# Patient Record
Sex: Female | Born: 1985 | Race: Black or African American | Hispanic: No | Marital: Single | Smoking: Former smoker
Health system: Southern US, Community
[De-identification: ages and names within clinical notes are randomized; demographics above are authoritative.]

## PROBLEM LIST (undated history)

## (undated) DIAGNOSIS — F32A Depression, unspecified: Secondary | ICD-10-CM

## (undated) DIAGNOSIS — F329 Major depressive disorder, single episode, unspecified: Secondary | ICD-10-CM

## (undated) DIAGNOSIS — N871 Moderate cervical dysplasia: Secondary | ICD-10-CM

## (undated) DIAGNOSIS — F419 Anxiety disorder, unspecified: Secondary | ICD-10-CM

## (undated) HISTORY — PX: COLPOSCOPY: SHX161

## (undated) HISTORY — DX: Moderate cervical dysplasia: N87.1

## (undated) HISTORY — PX: WISDOM TOOTH EXTRACTION: SHX21

---

## 2005-10-20 ENCOUNTER — Other Ambulatory Visit: Admission: RE | Admit: 2005-10-20 | Discharge: 2005-10-20 | Payer: Self-pay | Admitting: Gynecology

## 2006-05-09 ENCOUNTER — Emergency Department (HOSPITAL_COMMUNITY): Admission: EM | Admit: 2006-05-09 | Discharge: 2006-05-09 | Payer: Self-pay | Admitting: Emergency Medicine

## 2006-06-02 ENCOUNTER — Emergency Department (HOSPITAL_COMMUNITY): Admission: EM | Admit: 2006-06-02 | Discharge: 2006-06-02 | Payer: Self-pay | Admitting: Emergency Medicine

## 2006-10-24 ENCOUNTER — Other Ambulatory Visit: Admission: RE | Admit: 2006-10-24 | Discharge: 2006-10-24 | Payer: Self-pay | Admitting: Gynecology

## 2007-12-19 ENCOUNTER — Other Ambulatory Visit: Admission: RE | Admit: 2007-12-19 | Discharge: 2007-12-19 | Payer: Self-pay | Admitting: Gynecology

## 2008-07-14 ENCOUNTER — Other Ambulatory Visit: Admission: RE | Admit: 2008-07-14 | Discharge: 2008-07-14 | Payer: Self-pay | Admitting: Gynecology

## 2011-12-21 ENCOUNTER — Encounter (HOSPITAL_COMMUNITY): Payer: Self-pay | Admitting: Emergency Medicine

## 2011-12-21 ENCOUNTER — Emergency Department (HOSPITAL_COMMUNITY)
Admission: EM | Admit: 2011-12-21 | Discharge: 2011-12-22 | Disposition: A | Discharge status: Home / Self Care | Payer: Self-pay | Attending: Emergency Medicine | Admitting: Emergency Medicine

## 2011-12-21 ENCOUNTER — Encounter (HOSPITAL_COMMUNITY): Payer: Self-pay | Admitting: *Deleted

## 2011-12-21 ENCOUNTER — Emergency Department (INDEPENDENT_AMBULATORY_CARE_PROVIDER_SITE_OTHER)
Admission: EM | Admit: 2011-12-21 | Discharge: 2011-12-21 | Disposition: A | Discharge status: Nursing Home (Includes ICF) | Payer: Self-pay | Source: Home / Self Care | Attending: Emergency Medicine | Admitting: Emergency Medicine

## 2011-12-21 DIAGNOSIS — T63391A Toxic effect of venom of other spider, accidental (unintentional), initial encounter: Secondary | ICD-10-CM | POA: Insufficient documentation

## 2011-12-21 DIAGNOSIS — R2990 Unspecified symptoms and signs involving the nervous system: Secondary | ICD-10-CM

## 2011-12-21 DIAGNOSIS — T63311A Toxic effect of venom of black widow spider, accidental (unintentional), initial encounter: Secondary | ICD-10-CM

## 2011-12-21 DIAGNOSIS — R29818 Other symptoms and signs involving the nervous system: Secondary | ICD-10-CM

## 2011-12-21 DIAGNOSIS — T6391XA Toxic effect of contact with unspecified venomous animal, accidental (unintentional), initial encounter: Secondary | ICD-10-CM | POA: Insufficient documentation

## 2011-12-21 LAB — CBC WITH DIFFERENTIAL/PLATELET
Basophils Absolute: 0.1 10*3/uL (ref 0.0–0.1)
Eosinophils Relative: 2 % (ref 0–5)
HCT: 37.5 % (ref 36.0–46.0)
Lymphocytes Relative: 25 % (ref 12–46)
MCHC: 33.3 g/dL (ref 30.0–36.0)
MCV: 83.7 fL (ref 78.0–100.0)
Monocytes Absolute: 0.7 10*3/uL (ref 0.1–1.0)
RDW: 13.2 % (ref 11.5–15.5)
WBC: 13.1 10*3/uL — ABNORMAL HIGH (ref 4.0–10.5)

## 2011-12-21 LAB — COMPREHENSIVE METABOLIC PANEL
AST: 15 U/L (ref 0–37)
CO2: 25 mEq/L (ref 19–32)
Calcium: 9.6 mg/dL (ref 8.4–10.5)
Creatinine, Ser: 0.65 mg/dL (ref 0.50–1.10)
GFR calc Af Amer: >90 mL/min (ref >90)
GFR calc non Af Amer: >90 mL/min (ref >90)

## 2011-12-21 LAB — URINALYSIS, ROUTINE W REFLEX MICROSCOPIC
Nitrite: NEGATIVE
Specific Gravity, Urine: 1.024 (ref 1.005–1.030)
Urobilinogen, UA: 0.2 mg/dL (ref 0.0–1.0)

## 2011-12-21 LAB — URINE MICROSCOPIC-ADD ON

## 2011-12-21 LAB — PREGNANCY, URINE: Preg Test, Ur: NEGATIVE

## 2011-12-21 NOTE — ED Notes (Signed)
Patient reports feeling nauseated, stomach pain, diarrhea, no vomiting.  Patient has a headache now.  Reports numbness and warmth sensation in  right arm, right side of face. Patient reports blurred vision in right eye.  Patient noticed bump to right upper arm, initially was red.  Now areas is purple. Right radial pulse 2 plus.  Patient drew a circle around area on right upper arm.  Symptom onset around 11:30 today.  Patient thinks this was a spider bite, she was sweeping an area that has spiders.

## 2011-12-21 NOTE — ED Notes (Signed)
PT reports on menstrual cycle after providing urine sample.

## 2011-12-21 NOTE — ED Notes (Signed)
Pt is oriented x3 and speaking full sentences without difficulty breathing. Pt believes she was beaten by a spider while sweeping, and also states seeing a wasp. Pt's bite is on her Rt upper forearm and its tender and red. Pt states no pain if you touch area and some blurriness to the Rt eye. Skin is warm and dryer and I advise Pt to inform the front desk if anything changes.

## 2011-12-21 NOTE — ED Provider Notes (Addendum)
History     CSN: [id number]  Arrival date & time 12/21/11  1455   First MD Initiated Contact with Patient 12/21/11 1614      Chief Complaint  Patient presents with  . Insect Bite    (Consider location/radiation/quality/duration/timing/severity/associated sxs/prior treatment) HPI Comments: Patient presents urgent care complaining of feeling nauseous having abdominal discomforts (points to epigastric area), diarrhea and a headache. All the symptoms started after 11:30 this morning, along with other symptoms that established afterwards as the right side of her face or upper arm down to her hand and her right lower leg feels numb. At one point she felt that her right leg was weaker to the point that she stumbled a bit but that has improved since then. Patient was sweeping the floor at work when symptoms first started. She reports a lot of spider webs and was informed that she had a black spot on her right inner aspect of her right upper arm. She did not see anything biting or stinging her but does have significant tenderness, he is wondering if she was stung or bitten by a spider, I suspect that her symptoms might be related to this. She also reports right eye blurriness.  The history is provided by the patient.    History reviewed. No pertinent past medical history.  History reviewed. No pertinent past surgical history.  No family history on file.  History  Substance Use Topics  . Smoking status: Never Smoker   . Smokeless tobacco: Not on file  . Alcohol Use: Yes    OB History    Grav Para Term Preterm Abortions TAB SAB Ect Mult Living                  Review of Systems  Constitutional: Positive for activity change. Negative for fever, chills, diaphoresis, appetite change and fatigue.  HENT: Negative for neck stiffness.   Eyes: Positive for visual disturbance. Negative for photophobia and redness.  Gastrointestinal: Positive for nausea, abdominal pain and diarrhea. Negative  for blood in stool.  Musculoskeletal: Negative for joint swelling.  Skin: Positive for color change and rash. Negative for wound.  Neurological: Positive for numbness and headaches. Negative for dizziness, tremors, syncope, speech difficulty, weakness and light-headedness.    Allergies  Citric acid  Home Medications   Current Outpatient Rx  Name Route Sig Dispense Refill  . VALACYCLOVIR HCL 500 MG PO TABS Oral Take 500 mg by mouth 2 (two) times daily.      BP 131/84  Pulse 82  Temp 99.4 F (37.4 C) (Oral)  Resp 16  SpO2 96%  LMP 12/21/2011  Physical Exam  Nursing note and vitals reviewed. Constitutional: Vital signs are normal. She appears well-developed and well-nourished.  Eyes: Conjunctivae and EOM are normal. Pupils are equal, round, and reactive to light. Right eye exhibits no chemosis and no exudate. Left eye exhibits no exudate. No scleral icterus. Pupils are equal.  Fundoscopic exam:      The right eye shows no exudate.       The left eye shows no exudate.  Neck: Neck supple.  Abdominal: Soft. She exhibits no distension. There is no tenderness. There is no rebound and no guarding.  Musculoskeletal: She exhibits tenderness. She exhibits no edema.  Neurological: She is alert. She has normal strength. She displays no tremor. A sensory deficit is present. No cranial nerve deficit. She exhibits normal muscle tone. GCS eye subscore is 4. GCS verbal subscore is 5. GCS motor subscore  is 6.       Patient unable to discriminate 2 point tactile touch at 5-10 mm- ion affected areas- RLE-RUE-R FACE  Skin: Bruising noted. No laceration and no lesion noted. There is erythema.       ED Course  Procedures (including critical care time)  Labs Reviewed - No data to display No results found.   1. Multiple neurological symptoms       MDM   Patient presents urgent care with multiple complaints including neurological symptoms describes paresthesias on her face and both upper  and lower extremities associated with visual change (OD), along with her new neurological symptoms she is also describing acute onset of gastrointestinal symptoms (her abdominal exam was unremarkable). Patient is also reporting a suspicion of a spider bite. I have no explanation of her symptoms even considering that she could have had sustained a spider bite with some systemic symptoms for her unilateral onset of neurological symptoms. Patient demonstrated no muscular deficits but somewhat altered and abnormal sensorial tactile deficits on the areas. Transfer patient to the emergency department for further evaluation. Patient looks somewhat comfortable and afebrile.       Jimmie Molly, MD 12/21/11 1610  Jimmie Molly, MD 12/21/11 9604  Jimmie Molly, MD 12/21/11 [phone]

## 2011-12-21 NOTE — ED Notes (Signed)
The  Pt was transferred from [facility].  She thinks she was bitten by a spider to her rt upper arm this am while sweeping spiders this am.  The pt started having numbness rt arm followed by nv and diarrhea headache with  Numbness rt face and rt leg.  The pt no longer has numbness in her rt leg

## 2011-12-21 NOTE — ED Notes (Signed)
Pt states she discovered "spider bite" around 1130 this morning and it was "reddish purple" in color. Went to urgent care and it was "purpleish" in color around 1430 today. Pt arrived to [facility] around 1730 today and the "spider bite" is dark purple/blackish and has expanded outside the original circle that the pt marked around 1130 when she noticed the bite.  Pt has palpable hard area about the size of a large pea slightly lateral to the red spot in the middle of the blackish colored area. Pt is experiencing changes in sensation - numbness in R side upper and lower extremities. Pain 2/10 increases to 4/10 with palpation. Also states pain in back since arrival to [facility]

## 2011-12-22 ENCOUNTER — Emergency Department (HOSPITAL_COMMUNITY): Payer: Self-pay

## 2011-12-22 MED ORDER — LORAZEPAM 1 MG PO TABS: 1.0000 mg | ORAL_TABLET | Freq: Three times a day (TID) | ORAL | Status: AC | PRN

## 2011-12-22 NOTE — ED Provider Notes (Addendum)
History     CSN: [id number]  Arrival date & time 12/21/11  1758   First MD Initiated Contact with Patient 12/21/11 2320      Chief Complaint  Patient presents with  . numbness transient     (Consider location/radiation/quality/duration/timing/severity/associated sxs/prior treatment) Patient is a 26 y.o. female presenting with animal bite. The history is provided by the patient.  Animal Bite  The incident occurred today (pt was at working cleaning up spider webs and later started to develop below sx.). The incident occurred at work. She came to the ER via personal transport. There is an injury to the right upper arm. The pain is mild. It is unlikely that a foreign body is present. Associated symptoms include numbness, abdominal pain, nausea, vomiting, headaches and tingling. Pertinent negatives include no chest pain, no loss of consciousness, no seizures, no cough and no difficulty breathing. She is right-handed. There were no sick contacts. Recently, medical care has been given at another facility (seen at urgent care and sent here).    History reviewed. No pertinent past medical history.  History reviewed. No pertinent past surgical history.  No family history on file.  History  Substance Use Topics  . Smoking status: Never Smoker   . Smokeless tobacco: Not on file  . Alcohol Use: Yes    OB History    Grav Para Term Preterm Abortions TAB SAB Ect Mult Living                  Review of Systems  Respiratory: Negative for cough.   Cardiovascular: Negative for chest pain.  Gastrointestinal: Positive for nausea, vomiting and abdominal pain.  Neurological: Positive for tingling, numbness and headaches. Negative for seizures and loss of consciousness.  All other systems reviewed and are negative.    Allergies  Citric acid  Home Medications   Current Outpatient Rx  Name Route Sig Dispense Refill  . VALACYCLOVIR HCL 500 MG PO TABS Oral Take 500 mg by mouth daily.        BP 134/90  Pulse 73  Temp 98.6 F (37 C) (Oral)  Resp 20  SpO2 99%  LMP 12/21/2011  Physical Exam  Nursing note and vitals reviewed. Constitutional: She is oriented to person, place, and time. She appears well-developed and well-nourished. No distress.  HENT:  Head: Normocephalic and atraumatic.  Mouth/Throat: Oropharynx is clear and moist.  Eyes: Conjunctivae and EOM are normal. Pupils are equal, round, and reactive to light.  Neck: Normal range of motion. Neck supple.  Cardiovascular: Normal rate, regular rhythm and intact distal pulses.   No murmur heard. Pulmonary/Chest: Effort normal and breath sounds normal. No respiratory distress. She has no wheezes. She has no rales.  Abdominal: Soft. She exhibits no distension. There is no tenderness. There is no rebound and no guarding.  Musculoskeletal: Normal range of motion. She exhibits no edema and no tenderness.  Neurological: She is alert and oriented to person, place, and time. She has normal strength. She displays no tremor. No cranial nerve deficit or sensory deficit. Coordination and gait normal.  Skin: Skin is warm and dry. No rash noted. No erythema.  Psychiatric: She has a normal mood and affect. Her behavior is normal.    ED Course  Procedures (including critical care time)  Labs Reviewed  CBC WITH DIFFERENTIAL - Abnormal; Notable for the following:    WBC 13.1 (*)     Neutro Abs 8.8 (*)     All other components within  normal limits  COMPREHENSIVE METABOLIC PANEL - Abnormal; Notable for the following:    Total Bilirubin 1.9 (*)     All other components within normal limits  URINALYSIS, ROUTINE W REFLEX MICROSCOPIC - Abnormal; Notable for the following:    APPearance CLOUDY (*)     Hgb urine dipstick LARGE (*)     Ketones, ur 15 (*)     Leukocytes, UA TRACE (*)     All other components within normal limits  URINE MICROSCOPIC-ADD ON - Abnormal; Notable for the following:    Squamous Epithelial / LPF FEW (*)      All other components within normal limits  PREGNANCY, URINE   Ct Head Wo Contrast  12/22/2011  *RADIOLOGY REPORT*  Clinical Data: Right-sided weakness; headaches and right-sided tingling and numbness.  CT HEAD WITHOUT CONTRAST  Technique:  Contiguous axial images were obtained from the base of the skull through the vertex without contrast.  Comparison: None.  Findings: There is no evidence of acute infarction, mass lesion, or intra- or extra-axial hemorrhage on CT.  The posterior fossa, including the cerebellum, brainstem and fourth ventricle, is within normal limits.  The third and lateral ventricles, and basal ganglia are unremarkable in appearance.  The cerebral hemispheres are symmetric in appearance, with normal gray- white differentiation.  No mass effect or midline shift is seen.  There is no evidence of fracture; visualized osseous structures are unremarkable in appearance.  The visualized portions of the orbits are within normal limits.  The paranasal sinuses and mastoid air cells are well-aerated.  No significant soft tissue abnormalities are seen.  IMPRESSION: Unremarkable noncontrast CT of the head.  Original Report Authenticated By: Tonia Ghent, M.D.     1. Black widow spider bite       MDM   Patient with a history of a possible spider bite today. Given patient's symptoms of nausea vomiting abdominal cramping numbness and tingling in her extremities but most consistent with a black widow bite. Have been 12 hours since the bite and she has an ecchymotic area on her right arm but most of her symptoms now have resolved. Because she was complaining of only right sided tingling head CT was done and was within normal limits.  Patient's labs were within normal limits other than a mild leukocytosis of 13,000.  But normal calcium.  Patient was not given any medication initially and now do not feel that she needs anything. Discussed with her the results and will give her a prescription for  Ativan in case she gets return of abdominal cramping and numbness. She was given strict return precautions and she was discharged home.        Gwyneth Sprout, MD 12/22/11 0111  Gwyneth Sprout, MD 12/22/11 2130

## 2013-08-17 IMAGING — CT CT HEAD W/O CM
1 of 2 series · 13 of 30 positions shown, 17 images · non-contrast
Comparison: None.

CLINICAL DATA: Right-sided weakness; headaches and right-sided
tingling and numbness.

CT HEAD WITHOUT CONTRAST
TECHNIQUE: Contiguous axial images were obtained from the base of
the skull through the vertex without contrast.

[Series 2: brain · axial · 0.49mm/px · z∈[+125,+255]mm · 13 of 32 slices shown, 17 images]
[im 3/32  brain]
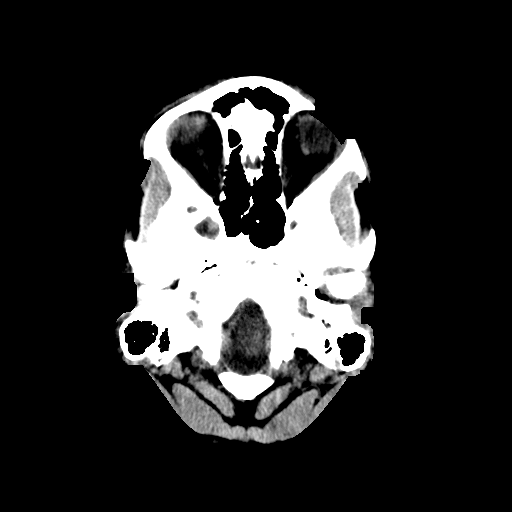
[im 3/32  bone]
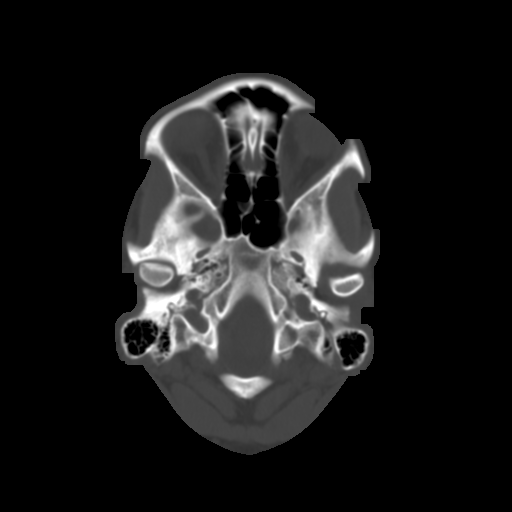
[im 5/32  brain]
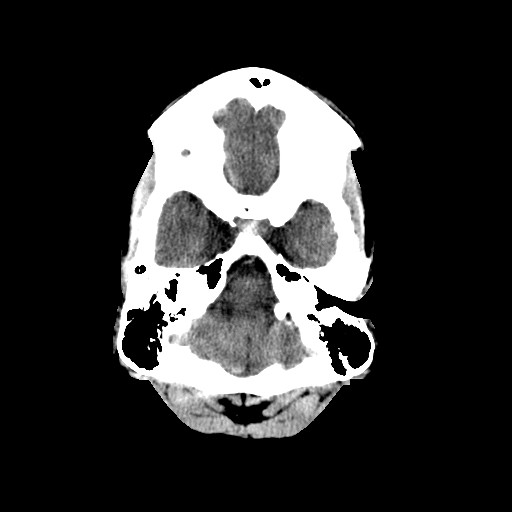
[im 7/32  brain]
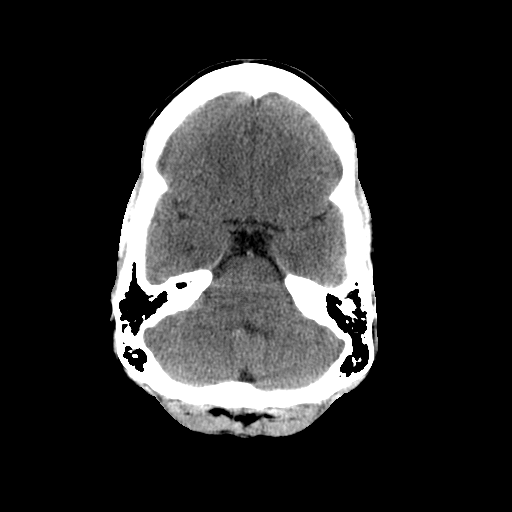
[im 9/32  brain]
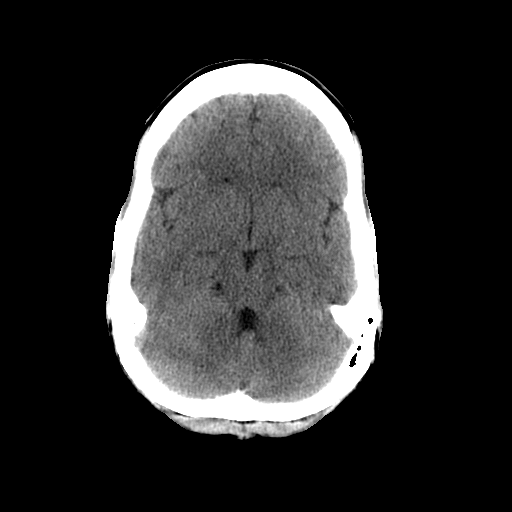
[im 12/32  brain]
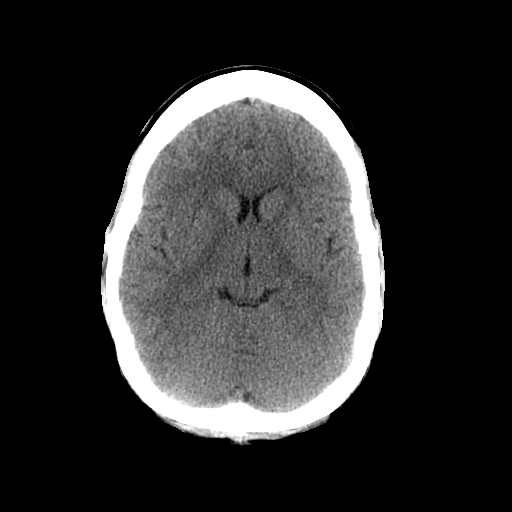
[im 12/32  bone]
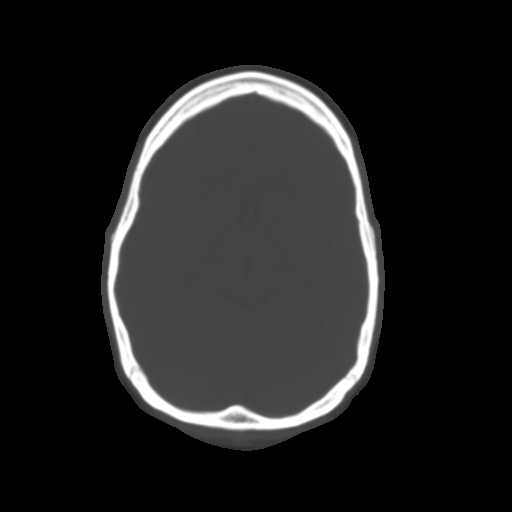
[im 14/32  brain]
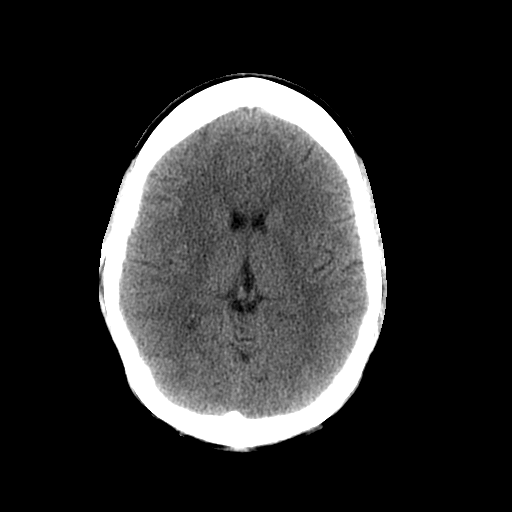
[im 16/32  brain]
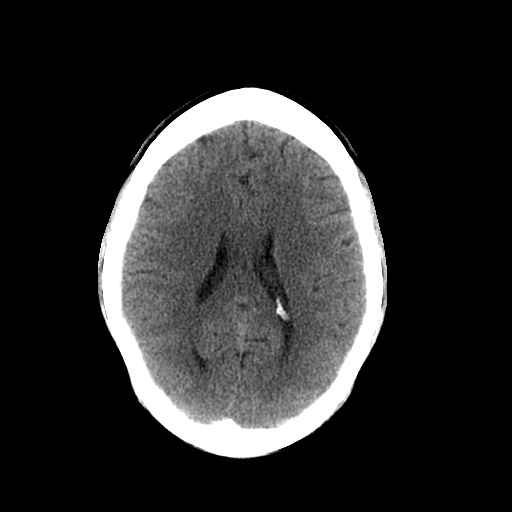
[im 18/32  brain]
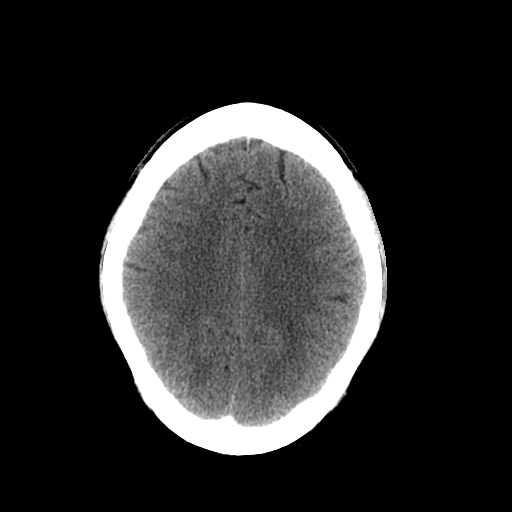
[im 20/32  brain]
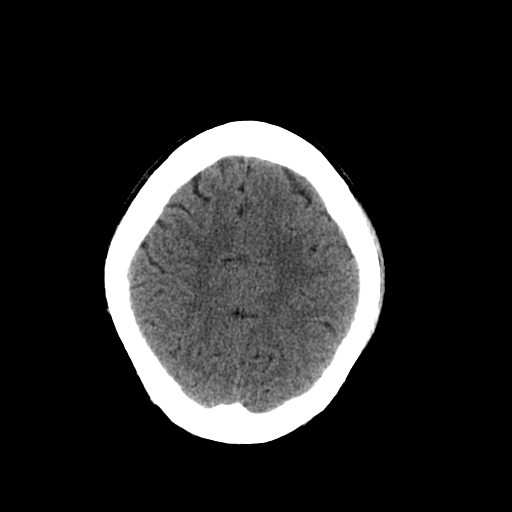
[im 20/32  bone]
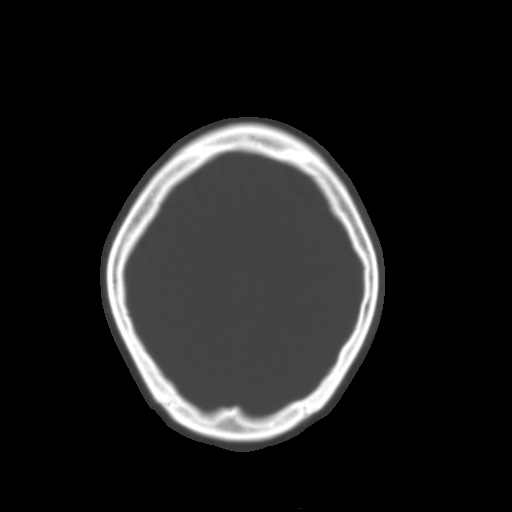
[im 23/32  brain]
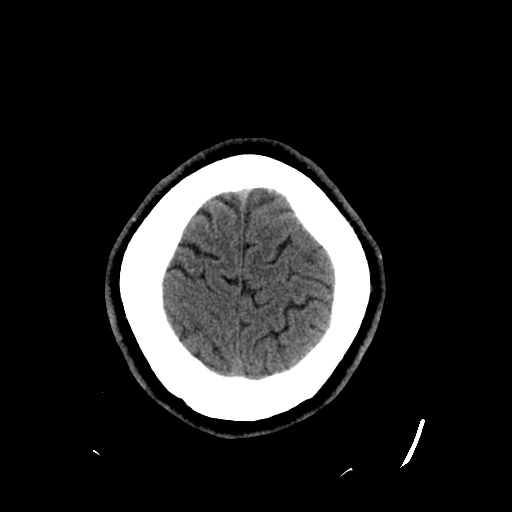
[im 25/32  brain]
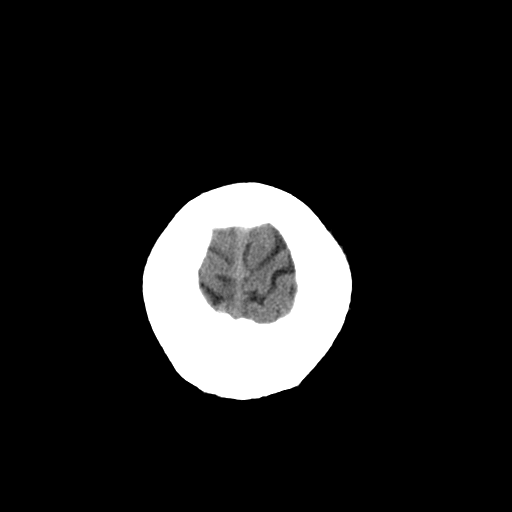
[im 27/32  brain]
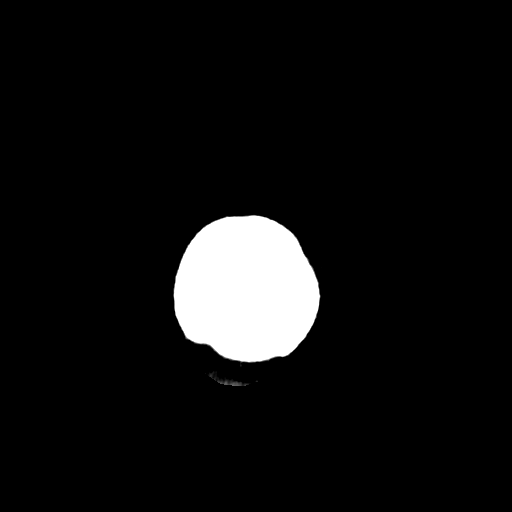
[im 29/32  brain]
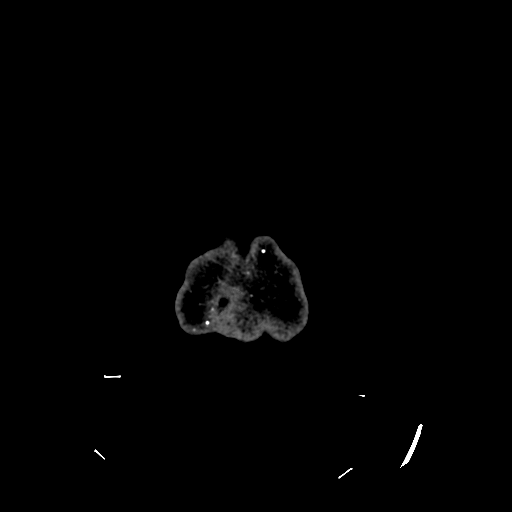
[im 29/32  bone]
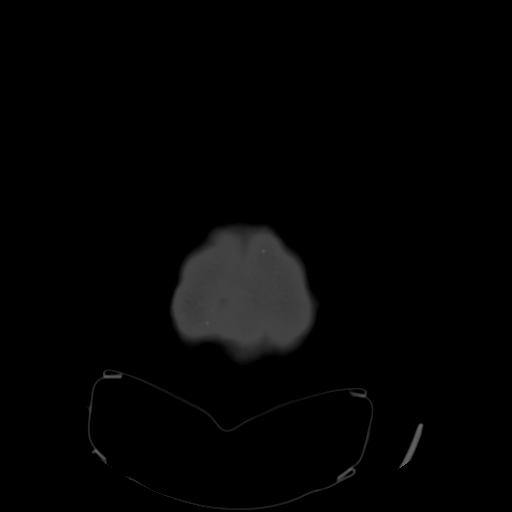

[13 of 30 positions shown; findings below may reference images not displayed]

FINDINGS: There is no evidence of acute infarction, mass lesion, or
intra- or extra-axial hemorrhage on CT.

The posterior fossa, including the cerebellum, brainstem and fourth
ventricle, is within normal limits.  The third and lateral
ventricles, and basal ganglia are unremarkable in appearance.  The
cerebral hemispheres are symmetric in appearance, with normal gray-
white differentiation.  No mass effect or midline shift is seen.

There is no evidence of fracture; visualized osseous structures are
unremarkable in appearance.  The visualized portions of the orbits
are within normal limits.  The paranasal sinuses and mastoid air
cells are well-aerated.  No significant soft tissue abnormalities
are seen.
IMPRESSION: Unremarkable noncontrast CT of the head.

## 2014-04-10 ENCOUNTER — Other Ambulatory Visit (HOSPITAL_COMMUNITY): Payer: Self-pay | Admitting: *Deleted

## 2014-04-10 DIAGNOSIS — N644 Mastodynia: Secondary | ICD-10-CM

## 2014-05-01 ENCOUNTER — Other Ambulatory Visit (HOSPITAL_COMMUNITY): Payer: Self-pay | Admitting: *Deleted

## 2014-05-01 ENCOUNTER — Encounter (HOSPITAL_COMMUNITY): Payer: Self-pay

## 2014-05-01 ENCOUNTER — Ambulatory Visit
Admission: RE | Admit: 2014-05-01 | Discharge: 2014-05-01 | Disposition: A | Discharge status: Home / Self Care | Payer: No Typology Code available for payment source | Source: Ambulatory Visit | Attending: Obstetrics and Gynecology | Admitting: Obstetrics and Gynecology

## 2014-05-01 ENCOUNTER — Ambulatory Visit (HOSPITAL_COMMUNITY)
Admission: RE | Admit: 2014-05-01 | Discharge: 2014-05-01 | Disposition: A | Discharge status: Home / Self Care | Payer: Self-pay | Source: Ambulatory Visit | Attending: Obstetrics and Gynecology | Admitting: Obstetrics and Gynecology

## 2014-05-01 VITALS — BP 112/70 | Ht 63.0 in | Wt 188.2 lb

## 2014-05-01 DIAGNOSIS — Z1239 Encounter for other screening for malignant neoplasm of breast: Secondary | ICD-10-CM

## 2014-05-01 DIAGNOSIS — N644 Mastodynia: Secondary | ICD-10-CM

## 2014-05-01 HISTORY — DX: Anxiety disorder, unspecified: F41.9

## 2014-05-01 HISTORY — DX: Major depressive disorder, single episode, unspecified: F32.9

## 2014-05-01 HISTORY — DX: Depression, unspecified: F32.A

## 2014-05-01 NOTE — Progress Notes (Signed)
Complaints of bilateral outer breast pain that is greater and more frequent within the left breast. Patient rated pain in the left breast at a 5-6 out of 10.  Pap Smear:  Pap smear not completed today. Last Pap smear was in February 2015 at the free cervical cancer screening offered at the [facility] and normal per patient. Per patient has a history of three abnormal Pap smears that a colposcopy was completed for follow-up. The first abnormal Pap smear was 07/14/2008 and the other two were between the first abnormal and most recent Pap smear. The only Pap smear and colposcopy result in EPIC is the ones in 2010.  Physical exam: Breasts Breasts symmetrical. No skin abnormalities bilateral breasts. No nipple retraction bilateral breasts. No nipple discharge bilateral breasts. No lymphadenopathy. No lumps palpated bilateral breasts. Complaints of left outer breast tenderness on exam. Referred patient to the [facility] for possible bilateral breast ultrasounds. Appointment scheduled for Thursday, May 01, 2014 at 1100.     Pelvic/Bimanual No Pap smear completed today since last Pap smear was in February 2015 per patient. Pap smear not indicated per BCCCP guidelines.

## 2014-05-01 NOTE — Patient Instructions (Signed)
Explained to Sophia Phillips that she did not need a Pap smear today due to last Pap smear was in February 2015 per patient. Let patient know her next Pap smear will be due in one year due to her recent history of an abnormal Pap smear. Told patient she can have next Pap smear completed at [facility] and to call Sabrina to schedule. Referred patient to the [facility] for possible bilateral breast ultrasounds. Appointment scheduled for Thursday, May 01, 2014 at 1100. Patient aware of appointment and will be there. Zenaya Ulatowski verbalized understanding.  Eugean Arnott, Arvil Chaco, RN 11:31 AM

## 2014-05-01 NOTE — Addendum Note (Signed)
Encounter addended by: Loletta Parish, RN on: 05/01/2014  3:53 PM<BR>     Documentation filed: Visit Diagnoses

## 2014-05-13 ENCOUNTER — Other Ambulatory Visit: Payer: No Typology Code available for payment source

## 2014-05-13 ENCOUNTER — Ambulatory Visit
Admission: RE | Admit: 2014-05-13 | Discharge: 2014-05-13 | Disposition: A | Discharge status: Home / Self Care | Payer: No Typology Code available for payment source | Source: Ambulatory Visit | Attending: Obstetrics and Gynecology | Admitting: Obstetrics and Gynecology

## 2014-05-13 ENCOUNTER — Inpatient Hospital Stay: Admission: RE | Admit: 2014-05-13 | Payer: No Typology Code available for payment source | Source: Ambulatory Visit

## 2014-05-13 DIAGNOSIS — N644 Mastodynia: Secondary | ICD-10-CM

## 2014-08-12 DIAGNOSIS — R87810 Cervical high risk human papillomavirus (HPV) DNA test positive: Principal | ICD-10-CM

## 2014-08-12 DIAGNOSIS — R87610 Atypical squamous cells of undetermined significance on cytologic smear of cervix (ASC-US): Secondary | ICD-10-CM | POA: Insufficient documentation

## 2014-09-03 ENCOUNTER — Encounter (HOSPITAL_COMMUNITY): Payer: Self-pay | Admitting: *Deleted

## 2014-09-03 ENCOUNTER — Telehealth (HOSPITAL_COMMUNITY): Payer: Self-pay | Admitting: *Deleted

## 2014-09-03 NOTE — Telephone Encounter (Signed)
Patient returned call. Advised patient had scheduled appointment with the [facility] for colpo. Patient is scheduled for March 31. Patient to show pink card when goes for appointment. Patient voiced understanding.

## 2014-09-11 ENCOUNTER — Encounter: Payer: Self-pay | Admitting: Obstetrics & Gynecology

## 2014-10-15 ENCOUNTER — Encounter: Payer: Self-pay | Admitting: Obstetrics & Gynecology

## 2014-10-22 ENCOUNTER — Ambulatory Visit (INDEPENDENT_AMBULATORY_CARE_PROVIDER_SITE_OTHER): Payer: Self-pay | Admitting: Obstetrics & Gynecology

## 2014-10-22 ENCOUNTER — Encounter: Payer: Self-pay | Admitting: Obstetrics & Gynecology

## 2014-10-22 ENCOUNTER — Other Ambulatory Visit (HOSPITAL_COMMUNITY)
Admission: RE | Admit: 2014-10-22 | Discharge: 2014-10-22 | Disposition: A | Discharge status: Home / Self Care | Payer: Medicaid Other | Source: Ambulatory Visit | Attending: Obstetrics & Gynecology | Admitting: Obstetrics & Gynecology

## 2014-10-22 VITALS — BP 150/81 | HR 83 | Temp 97.9°F | Ht 63.0 in | Wt 182.4 lb

## 2014-10-22 DIAGNOSIS — Z3202 Encounter for pregnancy test, result negative: Secondary | ICD-10-CM

## 2014-10-22 DIAGNOSIS — R87610 Atypical squamous cells of undetermined significance on cytologic smear of cervix (ASC-US): Secondary | ICD-10-CM

## 2014-10-22 DIAGNOSIS — N871 Moderate cervical dysplasia: Secondary | ICD-10-CM

## 2014-10-22 DIAGNOSIS — N942 Vaginismus: Secondary | ICD-10-CM

## 2014-10-22 DIAGNOSIS — R87810 Cervical high risk human papillomavirus (HPV) DNA test positive: Principal | ICD-10-CM

## 2014-10-22 HISTORY — DX: Moderate cervical dysplasia: N87.1

## 2014-10-22 LAB — POCT PREGNANCY, URINE: PREG TEST UR: NEGATIVE

## 2014-10-22 NOTE — Progress Notes (Signed)
    GYNECOLOGY CLINIC COLPOSCOPY PROCEDURE NOTE  29 y.o. G0P0000 here for colposcopy for ASCUS with POSITIVE high risk HPV pap smear on 08/12/14. Discussed role for HPV in cervical dysplasia, need for surveillance.  Patient given informed consent, signed copy in the chart, time out was performed.  Placed in lithotomy position. Cervix viewed with speculum and colposcope after application of acetic acid.   Physical Exam  Genitourinary:      Colposcopy adequate? Yes Acetowhite lesion(s) noted at 12 o'clock; corresponding biopsyobtained.  ECC specimen obtained. All specimens were labelled and sent to pathology.  Patient was given post procedure instructions.  Will follow up pathology and manage accordingly.  Routine preventative health maintenance measures emphasized.    Sophia Schneiders, MD, [facility] Attending [place] for [employer], [place]

## 2014-10-22 NOTE — Patient Instructions (Addendum)
Colposcopy, Care After Refer to this sheet in the next few weeks. These instructions provide you with information on caring for yourself after your procedure. Your health care provider may also give you more specific instructions. Your treatment has been planned according to current medical practices, but problems sometimes occur. Call your health care provider if you have any problems or questions after your procedure. WHAT TO EXPECT AFTER THE PROCEDURE  After your procedure, it is typical to have the following:  Cramping. This often goes away in a few minutes.  Soreness. This may last for 2 days.  Lightheadedness. Lie down for a few minutes if this occurs. You may also have some bleeding or dark discharge for a few days. You may need to wear a sanitary pad during this time. HOME CARE INSTRUCTIONS  Avoid sex, douching, and using tampons for 3 days or as directed by your health care provider.  Only take over-the-counter or prescription medicines as directed by your health care provider. Do not take aspirin because it can cause bleeding.  Continue to take birth control pills if you are on them.  Not all test results are available during your visit. If your test results are not back during the visit, make an appointment with your health care provider to find out the results. Do not assume everything is normal if you have not heard from your health care provider or the medical facility. It is important for you to follow up on all of your test results.  Follow your health care provider's advice regarding activity, follow-up visits, and follow-up Pap tests. SEEK MEDICAL CARE IF:  You develop a rash.  You have problems with your medicine. SEEK IMMEDIATE MEDICAL CARE IF:  You are bleeding heavily or are passing blood clots.  You have a fever.  You have abnormal vaginal discharge.  You are having cramps that do not go away after taking your pain medicine.  You feel lightheaded, dizzy, or  faint.  You have stomach pain. Document Released: 03/20/2013 Document Reviewed: 03/20/2013 [facility] Patient Information 2015 [place], [place]. This information is not intended to replace advice given to you by your health care provider. Make sure you discuss any questions you have with your health care provider.   MyChart allows you to send messages to your doctor, view your lab results (as released by your physician), manage appointments, and more. To sign up, log on to [email or web address] using the Address Bar in your browser. Once you are logged on, click on the Sign Up Now link and you will access the new member signup page. Enter your MyChart Activation Code exactly as it appears below to complete the sign-up process. If you do not sign up before the expiration date, you must request a new code.  MyChart Activation Code: [email or web address] Expires: 12/21/2014  1:55 PM  If you have questions, you can call [phone] ([phone]) or e-mail [email or web address]  to talk to our [facility] staff. Remember, MyChart is NOT to be used for urgent needs. For medical emergencies, dial 911.

## 2014-10-22 NOTE — Addendum Note (Signed)
Addended by: Verita Schneiders A on: 10/22/2014 02:35 PM   Modules accepted: Orders

## 2014-10-23 ENCOUNTER — Encounter: Payer: Self-pay | Admitting: Obstetrics & Gynecology

## 2014-10-29 ENCOUNTER — Telehealth: Payer: Self-pay

## 2014-10-29 NOTE — Telephone Encounter (Signed)
Called patient and informed her of results and appointment to discuss procedures. Briefly discussed both with patient. Patient verbalized understanding to all. No questions at this time.

## 2014-10-29 NOTE — Telephone Encounter (Signed)
-----   Message from Mallie Darting sent at 10/27/2014  1:49 PM EDT ----- Appointment is 06/09 @3 :15   ----- Message -----    From: Geanie Logan, RN    Sent: 10/24/2014   8:41 AM      To: [facility] Admin Pool  Please see below and schedule with Dr. Harolyn Rutherford soon. Please send back to clinical pool so that we can inform patient of results and appointment. Thank you!  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  Needs to come to discuss cryotherapy vs LEEP for management.  Needs follow up appointment with me soon.  Please call to inform patient of results and recommendations.

## 2014-11-20 ENCOUNTER — Ambulatory Visit (INDEPENDENT_AMBULATORY_CARE_PROVIDER_SITE_OTHER): Payer: Self-pay | Admitting: Obstetrics & Gynecology

## 2014-11-20 ENCOUNTER — Encounter: Payer: Self-pay | Admitting: Obstetrics & Gynecology

## 2014-11-20 VITALS — BP 131/75 | HR 116 | Temp 98.8°F | Ht 63.0 in | Wt 179.9 lb

## 2014-11-20 DIAGNOSIS — N871 Moderate cervical dysplasia: Secondary | ICD-10-CM

## 2014-11-20 NOTE — Patient Instructions (Signed)
Cervical cryotherapy is a procedure which involves freezing an area of abnormal tissue on the cervix. This tissue gradually disappears and the cervix heals. One cervical cryotherapy is usually sufficient to destroy the abnormal tissue.  Purpose  Cervical cryotherapy is a standard method used to treat cervical dysplasia, meaning the removal of abnormal cell tissue on the cervix.  Description  Cervical cryotherapy, or freezing, usually lasts about five minutes and causes a slight amount of discomfort. The procedure is usually performed in an outpatient setting.  Cervical cryotherapy is done by placing a small freeze-probe (cryoprobe) against the cervix that cools the cervix to sub-zero temperatures. The cells destroyed by freezing are shed afterwards in a heavy watery discharge. The main advantage of cryotherapy is that it is a simple procedure that requires inexpensive equipment.  The cryogenic device consists of a gas tank containing a refrigerant and non-explosive, non-toxic gas (usually nitrous oxide). The gas is delivered using flexible tubing through a gun-type attachment to the cryoprobe.  Diagnosis/Preparation  Women who undergo cervical cryotherapy typically have had an abnormal Pap smear which has led to a diagnosis of cervical squamous dysplasia and usually confirmed by biopsy after an adequate colposcopic exam.  Preparation for cervical cryotherapy involves scheduling the procedure when the patient is not experiencing heavy menstrual flow. Ibuprofen or naproxen sodium may be given before cryotherapy to decrease cramping. If there is any doubt about the pregnancy status, a pregnancy test is performed.  Aftercare  Cervical cryotherapy is often followed by a heavy and often odorous discharge during the first month after the procedure. The discharge is due to the dead tissue cells leaving the treatment site. The patient should abstain from sexual intercourse and not use tampons for a period of two  weeks after the procedure. Excessive exercise should also be avoided to lessen the occurrence of post-therapy bleeding.  Risks  The following risks have been associated with cervical cryotherapy:  Uterine cramping. Often occurs during the cryotherapy but rapidly subsides after treatment.  Bleeding and infection. Rare, but incidences have been reported.  More difficult Pap smears. Future Pap smears and colposcopy may be more difficult after cryotherapy.  Normal results  A normal result is no recurrence of the abnormal cervix cells. The first follow-up Pap smear is done at 12 months then 24 months after the procedure. If normal, patients resume usual pap smear screening.  If any, recurrences usually occur within two years of treatment.  If a follow-up Pap smear is abnormal, a colposcopy with biopsy is usually performed. Other treatment methods, usually the loop electrocautery excision procedure (LEEP) are then used if persistent disease is discovered.  Following the procedure, it is considered normal to experience the following:  slight cramping for two to three days  watery discharge requiring several pad changes daily  Bloody or brown discharge, especially 12-16 days after the procedure  Alternatives  Loop electrocautery excision procedure (LEEP). This procedure uses a fine wire loop with an electric current flowing through it to remove the desired area of the cervix. Loop excision is usually done under local anesthesia and causes very little discomfort.     

## 2014-11-20 NOTE — Progress Notes (Signed)
   CLINIC ENCOUNTER NOTE  History:  29 y.o. 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 She denies any abnormal vaginal discharge, bleeding, pelvic pain or other concerns.   Past Medical History  Diagnosis Date  . Depression   . Anxiety   . 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  [ ]  cryotherapy vs LEEP      History reviewed. No pertinent past surgical history.  The following portions of the patient's history were reviewed and updated as appropriate: allergies, current medications, past family history, past medical history, past social history, past surgical history and problem list.   Review of Systems:  A comprehensive review of systems was negative.  Objective:  Physical Exam BP 131/75 mmHg  Pulse 116  Temp(Src) 98.8 F (37.1 C) (Oral)  Ht 5\' 3"  (1.6 m)  Wt 179 lb 14.4 oz (81.602 kg)  BMI 31.88 kg/m2  LMP 11/03/2014 CONSTITUTIONAL: Well-developed, well-nourished female in no acute distress.  HENT:  Normocephalic, atraumatic, External right and left ear normal. Oropharynx is clear and moist EYES: Conjunctivae and EOM are normal. Pupils are equal, round, and reactive to light. No scleral icterus.  NECK: Normal range of motion, supple, no masses SKIN: Skin is warm and dry. No rash noted. Not diaphoretic. No erythema. No pallor. [place]: Alert and oriented to person, place, and time. Normal reflexes, muscle tone coordination. No cranial nerve deficit noted. PSYCHIATRIC: Normal mood and affect. Normal behavior. Normal judgment and thought content. CARDIOVASCULAR: Normal heart rate noted RESPIRATORY: Effort and breath sounds normal, no problems with respiration noted ABDOMEN: Soft, no distention noted.   PELVIC: Deferred MUSCULOSKELETAL: Normal range of motion. No edema noted.  Labs and Imaging 08/12/14 Pap smear with ASCUS, positive  HRHPV 10/22/14 Colposcopy showed CIN II, negative ECC  Assessment & Plan:  Discussed management of CIN II with LEEP vs cryotherapy, risks/benefits discussed in detail.  Patient wants to proceed with cryotherapy, this will be scheduled for patient.  Please refer to After Visit Summary for other counseling recommendations.    Total face-to-face time with patient: 15 minutes. Over 50% of encounter was spent on counseling and coordination of care.   Verita Schneiders, MD, [place] Attending [place] for [employer], [place]

## 2014-12-10 ENCOUNTER — Encounter: Payer: Self-pay | Admitting: Obstetrics & Gynecology

## 2014-12-10 ENCOUNTER — Ambulatory Visit (INDEPENDENT_AMBULATORY_CARE_PROVIDER_SITE_OTHER): Payer: Self-pay | Admitting: Obstetrics & Gynecology

## 2014-12-10 VITALS — BP 140/80 | HR 82 | Ht 63.75 in | Wt 181.0 lb

## 2014-12-10 DIAGNOSIS — N871 Moderate cervical dysplasia: Secondary | ICD-10-CM

## 2014-12-10 NOTE — Progress Notes (Signed)
    GYNECOLOGY CLINIC PROCEDURE NOTE  Cryotherapy details The indications for cryotherapy (CIN II) were reviewed with the patient in detail. She was counseled about that efficacy of this procedure, and possible need for excisional procedure in the future if her cervical dysplasia persists.  The risks of the procedure where explained in detail and patient was told to expect a copious amount of discharge in the next few weeks. All her questions were answered, and written informed consent was obtained.  The patient was placed in the dorsal lithotomy position and a vaginal speculum was placed. Her cervix was visualized and patient was noted to have had normal size transformation zone. The appropriate cryotherapy probe was picked and affixed to cryotherapy apparatus. Then nitrogen gas was then activated, the probe was coated with lubricating jelly and applied to the transformation zone of the cervix. This was kept in place for 3 minutes. The cryotherapy was then stopped and all instruments were removed from the patient's pelvis; a thawing period of 3 minutes was observed.  A second cycle of cryotherapy was then administered to the cervix for 3 minutes.  The patient tolerated the procedure well without any complications. Routine post procedure instructions were given to the patient.  Will repeat pap smear in 6 months and manage accordingly.   Verita Schneiders, MD, [place] Attending [place] for [employer], [place]

## 2014-12-10 NOTE — Patient Instructions (Signed)
Cervical cryotherapy is a procedure which involves freezing an area of abnormal tissue on the cervix. This tissue gradually disappears and the cervix heals. One cervical cryotherapy is usually sufficient to destroy the abnormal tissue.  Purpose  Cervical cryotherapy is a standard method used to treat cervical dysplasia, meaning the removal of abnormal cell tissue on the cervix.  Description  Cervical cryotherapy, or freezing, usually lasts about five minutes and causes a slight amount of discomfort. The procedure is usually performed in an outpatient setting.  Cervical cryotherapy is done by placing a small freeze-probe (cryoprobe) against the cervix that cools the cervix to sub-zero temperatures. The cells destroyed by freezing are shed afterwards in a heavy watery discharge. The main advantage of cryotherapy is that it is a simple procedure that requires inexpensive equipment.  The cryogenic device consists of a gas tank containing a refrigerant and non-explosive, non-toxic gas (usually nitrous oxide). The gas is delivered using flexible tubing through a gun-type attachment to the cryoprobe.  Diagnosis/Preparation  Women who undergo cervical cryotherapy typically have had an abnormal Pap smear which has led to a diagnosis of cervical squamous dysplasia and usually confirmed by biopsy after an adequate colposcopic exam.  Preparation for cervical cryotherapy involves scheduling the procedure when the patient is not experiencing heavy menstrual flow. Ibuprofen or naproxen sodium may be given before cryotherapy to decrease cramping. If there is any doubt about the pregnancy status, a pregnancy test is performed.  Aftercare  Cervical cryotherapy is often followed by a heavy and often odorous discharge during the first month after the procedure. The discharge is due to the dead tissue cells leaving the treatment site. The patient should abstain from sexual intercourse and not use tampons for a period of two  weeks after the procedure. Excessive exercise should also be avoided to lessen the occurrence of post-therapy bleeding.  Risks  The following risks have been associated with cervical cryotherapy:  Uterine cramping. Often occurs during the cryotherapy but rapidly subsides after treatment.  Bleeding and infection. Rare, but incidences have been reported.  More difficult Pap smears. Future Pap smears and colposcopy may be more difficult after cryotherapy.  Normal results  A normal result is no recurrence of the abnormal cervix cells. The first follow-up Pap smear is done at 12 months then 24 months after the procedure. If normal, patients resume usual pap smear screening.  If any, recurrences usually occur within two years of treatment.  If a follow-up Pap smear is abnormal, a colposcopy with biopsy is usually performed. Other treatment methods, usually the loop electrocautery excision procedure (LEEP) are then used if persistent disease is discovered.  Following the procedure, it is considered normal to experience the following:  slight cramping for two to three days  watery discharge requiring several pad changes daily  Bloody or brown discharge, especially 12-16 days after the procedure  Alternatives  Loop electrocautery excision procedure (LEEP). This procedure uses a fine wire loop with an electric current flowing through it to remove the desired area of the cervix. Loop excision is usually done under local anesthesia and causes very little discomfort.     

## 2015-06-04 ENCOUNTER — Other Ambulatory Visit (HOSPITAL_COMMUNITY)
Admission: RE | Admit: 2015-06-04 | Discharge: 2015-06-04 | Disposition: A | Discharge status: Home / Self Care | Payer: Medicaid Other | Source: Ambulatory Visit | Attending: Obstetrics and Gynecology | Admitting: Obstetrics and Gynecology

## 2015-06-04 ENCOUNTER — Ambulatory Visit (INDEPENDENT_AMBULATORY_CARE_PROVIDER_SITE_OTHER): Payer: Self-pay | Admitting: Obstetrics and Gynecology

## 2015-06-04 ENCOUNTER — Encounter: Payer: Self-pay | Admitting: Obstetrics and Gynecology

## 2015-06-04 VITALS — BP 141/85 | HR 98 | Temp 98.7°F | Ht 63.0 in | Wt 182.0 lb

## 2015-06-04 DIAGNOSIS — Z1151 Encounter for screening for human papillomavirus (HPV): Secondary | ICD-10-CM | POA: Insufficient documentation

## 2015-06-04 DIAGNOSIS — Z01411 Encounter for gynecological examination (general) (routine) with abnormal findings: Secondary | ICD-10-CM | POA: Insufficient documentation

## 2015-06-04 DIAGNOSIS — N871 Moderate cervical dysplasia: Secondary | ICD-10-CM

## 2015-06-04 DIAGNOSIS — Z124 Encounter for screening for malignant neoplasm of cervix: Secondary | ICD-10-CM

## 2015-06-04 NOTE — Progress Notes (Signed)
Patient ID: Sophia Phillips, female   DOB: 01-30-1986, 29 y.o.   MRN: [id number] 29 yo with h/o CIN II s/p cryotherarpy in 11/2014 here for repeat pap smear. Patient is also complaining of a vaginal odor that has been present since the cryotherapy  GENERAL: Well-developed, well-nourished female in no acute distress.  PELVIC: Normal external female genitalia. Vagina is pink and rugated.  Normal discharge. Normal appearing cervix. Uterus is normal in size. No adnexal mass or tenderness. EXTREMITIES: No cyanosis, clubbing, or edema, 2+ distal pulses.  A/P 29 yo with h/o CIN2 here for repeat pap smear - Pap smear collected - wet prep collected - patient will be contacted with abnormal results and management plan

## 2015-06-05 ENCOUNTER — Telehealth: Payer: Self-pay

## 2015-06-05 LAB — WET PREP, GENITAL
TRICH WET PREP: NONE SEEN
Yeast Wet Prep HPF POC: NONE SEEN

## 2015-06-05 LAB — CYTOLOGY - PAP

## 2015-06-05 MED ORDER — METRONIDAZOLE 500 MG PO TABS: 500.0000 mg | ORAL_TABLET | Freq: Two times a day (BID) | ORAL | Status: DC

## 2015-06-05 NOTE — Telephone Encounter (Signed)
Per Dr. Elly Modena, pt needs tx for BV.  Flagyl 500 mg po bid x 7 days e-prescribed.

## 2015-06-05 NOTE — Telephone Encounter (Signed)
Called pt and informed her of wet prep results showing BV and need for treatment. A prescription has been sent to her pharmacy.  Pt voiced understanding.

## 2015-06-23 ENCOUNTER — Telehealth: Payer: Self-pay | Admitting: *Deleted

## 2015-06-23 NOTE — Telephone Encounter (Signed)
Per Dr Elly Modena patient needs colposcopy. Her results are less abnormal than her previous paps however still require colpo. Appointment scheduled for 07/12/14 at 2pm. Called patient and left message to call us back.

## 2015-06-23 NOTE — Telephone Encounter (Addendum)
Called pt and informed her of Pap results and need for Colposcopy. Pt voiced understanding of the results and recommended plan of care but had confusion regarding several other concerns. She stated that she had received a bill for her Pap visit and was of the assumption that she would not receive a bill because she is in the [employer] program. Per chart review, pt was referred to our office from the [facility] program due to abnormal Pap on 08/12/14 and had Colposcopy on 10/22/14.  Pt subsequently had Cryosurgery of cervix on 12/10/14 and was advised to have repeat pap in 6 months which was performed on 06/04/15. Pt also expressed surprise that her follow up Pap was not scheduled with Dr. Harolyn Rutherford since she had performed the Cryo. I advised pt that I will need to check on her status with [facility] and also the billing procedure. I will call her back once I receive clarification. I also stated that pt may have Colposcopy scheduled with Dr. Harolyn Rutherford if she would prefer. Pt stated that Dr. Elly Modena took good care of her but for consistency she would prefer to see only one doctor and would like Coloscopy rescheduled with Dr. Harolyn Rutherford. Pt will be call with change in appointment as well as other information previously mentioned.   1/11  1110  Called pt and informed her that appt for Colpo has been changed to 2/13 @ 1400 w/Dr. Harolyn Rutherford. Pt voiced understanding.

## 2015-07-13 ENCOUNTER — Encounter: Payer: Medicaid Other | Admitting: Obstetrics and Gynecology

## 2015-07-27 ENCOUNTER — Telehealth: Payer: Self-pay | Admitting: *Deleted

## 2015-07-27 ENCOUNTER — Encounter: Payer: Medicaid Other | Admitting: Obstetrics & Gynecology

## 2015-07-27 NOTE — Telephone Encounter (Signed)
Patient wants to know if it is ok for her to travel with her medical conditions. Please call back.

## 2015-07-28 NOTE — Telephone Encounter (Signed)
I called pt and she stated that she is scheduled to go to [facility] on 3/10 for 1 week and wants to know if that will adversely affect her medical condition (abnormal pap).  Pt is currently waiting to get approved with the [facility] program so that she can have Colposcopy as she cannot afford the procedure at out of pocket expense. I told pt that her condition of abnormal Pap will not be affected by her travel plans. We do advise timely follow up as soon as she is able. If she has not heard back from the [facility] program within 1 week. She should call them again or call us back.  Pt voiced understanding.

## 2015-08-13 ENCOUNTER — Ambulatory Visit (HOSPITAL_COMMUNITY)
Admission: RE | Admit: 2015-08-13 | Discharge: 2015-08-13 | Disposition: A | Discharge status: Home / Self Care | Payer: Medicaid Other | Source: Ambulatory Visit | Attending: Obstetrics and Gynecology | Admitting: Obstetrics and Gynecology

## 2015-08-13 ENCOUNTER — Encounter (HOSPITAL_COMMUNITY): Payer: Self-pay

## 2015-08-13 VITALS — BP 138/90 | Temp 98.4°F | Ht 63.75 in | Wt 184.0 lb

## 2015-08-13 DIAGNOSIS — Z1239 Encounter for other screening for malignant neoplasm of breast: Secondary | ICD-10-CM

## 2015-08-13 DIAGNOSIS — R87610 Atypical squamous cells of undetermined significance on cytologic smear of cervix (ASC-US): Secondary | ICD-10-CM

## 2015-08-13 DIAGNOSIS — R87810 Cervical high risk human papillomavirus (HPV) DNA test positive: Secondary | ICD-10-CM

## 2015-08-13 NOTE — Progress Notes (Signed)
Patient referred to [facility] by the [facility] for a colposcopy to follow up for abnormal Pap smear. Last Pap smear was completed 06/04/2015.  Pap Smear:    Pap smear not completed today. Last Pap smear was 06/04/2015 at the [facility] and ASCUS HPV+. Referred patient to the [facility] for a colposcopy. Appointment scheduled for Friday, September 04, 2015 at 0800. Per patient has a history of four abnormal Pap smears that a colposcopy was completed for follow-up. The first abnormal Pap smear was 07/14/2008 and the other two were between the first abnormal and 2016. The last colposcopy was completed 10/22/2014 that showed CIN II with cryotherapy completed for follow up 11/20/2014. Last two Pap smear results along with the one 07/14/2008 are in EPIC. The last colposcopy result and the one in 2010 are in EPIC along with the notes from her cryotherapy.  Physical exam: Breasts Breasts symmetrical. No skin abnormalities bilateral breasts. No nipple retraction bilateral breasts. No nipple discharge bilateral breasts. No lymphadenopathy. No lumps palpated bilateral breasts. No complaints of pain or tenderness on exam. Screening mamogram recommended at age 89 unless clinically indicated prior.  Pelvic/Bimanual No Pap smear completed today since last Pap smear was 06/04/2015. Pap smear not indicated per BCCCP guidelines.   Smoking History: Patient has never smoked.  Patient Navigation: Patient education provided. Access to services provided for patient through [facility] program.

## 2015-08-13 NOTE — Patient Instructions (Signed)
Educational materials on self breast awareness given. Explained the colposcopy to Sophia Phillips the needed follow up for her abnormal Pap smear on 06/04/2015. Referred patient to the [facility] for a colposcopy. Appointment scheduled for Friday, September 04, 2015 at 0800. Patient aware of appointment and will be there or reschedule if needed. Let patient know she will need a screening mammogram at age 30 unless clinically indicated prior. Sophia Phillips verbalized understanding.  Khambrel Amsden, Arvil Chaco, RN 3:58 PM

## 2015-08-21 ENCOUNTER — Encounter (HOSPITAL_COMMUNITY): Payer: Self-pay | Admitting: *Deleted

## 2015-09-04 ENCOUNTER — Encounter: Payer: Medicaid Other | Admitting: Obstetrics & Gynecology

## 2015-09-23 ENCOUNTER — Other Ambulatory Visit (HOSPITAL_COMMUNITY)
Admission: RE | Admit: 2015-09-23 | Discharge: 2015-09-23 | Disposition: A | Discharge status: Home / Self Care | Payer: Self-pay | Source: Ambulatory Visit | Attending: Obstetrics & Gynecology | Admitting: Obstetrics & Gynecology

## 2015-09-23 ENCOUNTER — Encounter: Payer: Self-pay | Admitting: Obstetrics & Gynecology

## 2015-09-23 ENCOUNTER — Ambulatory Visit (INDEPENDENT_AMBULATORY_CARE_PROVIDER_SITE_OTHER): Payer: Self-pay | Admitting: Obstetrics & Gynecology

## 2015-09-23 VITALS — BP 140/88 | HR 95 | Ht 63.75 in | Wt 185.9 lb

## 2015-09-23 DIAGNOSIS — R87610 Atypical squamous cells of undetermined significance on cytologic smear of cervix (ASC-US): Secondary | ICD-10-CM

## 2015-09-23 DIAGNOSIS — R87810 Cervical high risk human papillomavirus (HPV) DNA test positive: Secondary | ICD-10-CM

## 2015-09-23 LAB — POCT PREGNANCY, URINE: PREG TEST UR: NEGATIVE

## 2015-09-23 NOTE — Progress Notes (Signed)
    GYNECOLOGY CLINIC COLPOSCOPY PROCEDURE NOTE  30 y.o. G0P0000 here for colposcopy for ASCUS with POSITIVE high risk HPV pap smear on 06/04/2015. History of CIN II s/p cryotherapy in 2016.  Discussed role for HPV in cervical dysplasia, need for surveillance.  Patient given informed consent, signed copy in the chart, time out was performed.  Placed in lithotomy position. Cervix viewed with speculum and colposcope after application of acetic acid.   Colposcopy adequate? Yes Acetowhite lesion(s) noted at 12 o'clock; corresponding biopsies obtained.  ECC specimen obtained. All specimens were labelled and sent to pathology.  Patient was given post procedure instructions.  Will follow up pathology and manage accordingly.  Routine preventative health maintenance measures emphasized.    Sophia Schneiders, MD, [place] Attending Obstetrician & Gynecologist, [place] for [facility]

## 2015-09-24 ENCOUNTER — Telehealth: Payer: Self-pay

## 2015-09-24 NOTE — Telephone Encounter (Signed)
Per Dr. Harolyn Rutherford, pt colposcopy is benign; follow up in a year with co-testing.  Called pt and informed pt results and the recommendations of the provider.  Pt stated understanding with no further questions.

## 2015-12-30 ENCOUNTER — Encounter (HOSPITAL_COMMUNITY): Payer: Self-pay | Admitting: *Deleted

## 2016-09-08 ENCOUNTER — Encounter (HOSPITAL_COMMUNITY): Payer: Self-pay

## 2016-09-08 ENCOUNTER — Ambulatory Visit (HOSPITAL_COMMUNITY)
Admission: RE | Admit: 2016-09-08 | Discharge: 2016-09-08 | Disposition: A | Discharge status: Home / Self Care | Payer: Self-pay | Source: Ambulatory Visit | Attending: Obstetrics and Gynecology | Admitting: Obstetrics and Gynecology

## 2016-09-08 VITALS — BP 128/70 | Temp 99.0°F | Ht 64.0 in | Wt 195.6 lb

## 2016-09-08 DIAGNOSIS — Z01419 Encounter for gynecological examination (general) (routine) without abnormal findings: Secondary | ICD-10-CM

## 2016-09-08 NOTE — Patient Instructions (Signed)
Explained breast self awareness with Hetty Blend. Let patient know that her next Pap smear and follow up will be based on the result of today's Pap smear. Let patient know will follow up with her within the next couple weeks with results of Pap smear by phone. Informed patient that she will need a screening mammogram at age 31 unless clinically indicated prior. Discussed smoking cessation. Referred patient to the [facility] Quitline and gave resources to free smoking cessation classes at [facility]. Sophia Phillips verbalized understanding.  Ayren Zumbro, Arvil Chaco, RN 3:22 PM

## 2016-09-08 NOTE — Progress Notes (Signed)
No complaints today.   Pap Smear: Pap smear completed today. Last Pap smear was 08/13/2015 at [facility] and ASCUS with positive HPV. Per patient has a history of five abnormal Pap smears that a colposcopy was completed for follow-up including the previous two. The first abnormal Pap smear was 07/14/2008 and the other two were between the first abnormal and 2016. She had a colposcopy completed 10/22/2014 that showed CIN II with cryotherapy completed for follow up 11/20/2014 and a colposcopy 09/23/2015 that was benign. Last three Pap smear results along with the one 07/14/2008 are in EPIC. The last two colposcopy results and the one in 2010 are in EPIC along with the notes from her cryotherapy  Physical exam: Breasts Breasts symmetrical. No skin abnormalities bilateral breasts. No nipple retraction bilateral breasts. No nipple discharge bilateral breasts. No lymphadenopathy. No lumps palpated bilateral breasts. No complaints of pain or tenderness on exam. Screening mamogram recommended at age 6 unless clinically indicated prior.  Pelvic/Bimanual   Ext Genitalia No lesions, no swelling and no discharge observed on external genitalia.         Vagina Vagina pink and normal texture. No lesions or discharge observed in vagina.          Cervix Cervix is present. Cervix pink and of normal texture. No discharge observed.     Uterus Uterus is present and palpable. Uterus in normal position and normal size.        Adnexae Bilateral ovaries present and palpable. No tenderness on palpation.          Rectovaginal No rectal exam completed today since patient had no rectal complaints. No skin abnormalities observed on exam.    Smoking History: Per patient occasionally smokes. Discussed smoking cessation. Referred patient to the [facility] Quitline and gave resources to free smoking cessation classes at [facility].  Patient Navigation: Patient education provided. Access to services provided for patient through [facility]  program.

## 2016-09-09 ENCOUNTER — Encounter (HOSPITAL_COMMUNITY): Payer: Self-pay | Admitting: *Deleted

## 2016-09-12 LAB — CYTOLOGY - PAP
Diagnosis: NEGATIVE
HPV: NOT DETECTED

## 2016-09-16 ENCOUNTER — Telehealth (HOSPITAL_COMMUNITY): Payer: Self-pay | Admitting: *Deleted

## 2016-09-16 NOTE — Telephone Encounter (Signed)
Telephoned patient at home number and advised patient of negative pap smear results. HPV was negative. Next pap smear due in one year due to history.

## 2017-05-19 ENCOUNTER — Other Ambulatory Visit (HOSPITAL_COMMUNITY): Payer: Self-pay | Admitting: *Deleted

## 2017-05-19 DIAGNOSIS — N644 Mastodynia: Secondary | ICD-10-CM

## 2017-06-08 ENCOUNTER — Ambulatory Visit (HOSPITAL_COMMUNITY): Payer: No Typology Code available for payment source

## 2017-06-08 ENCOUNTER — Other Ambulatory Visit: Payer: No Typology Code available for payment source

## 2017-10-10 ENCOUNTER — Ambulatory Visit (HOSPITAL_COMMUNITY)
Admission: RE | Admit: 2017-10-10 | Discharge: 2017-10-10 | Disposition: A | Discharge status: Home / Self Care | Payer: No Typology Code available for payment source | Source: Ambulatory Visit | Attending: Obstetrics and Gynecology | Admitting: Obstetrics and Gynecology

## 2017-10-10 ENCOUNTER — Other Ambulatory Visit (HOSPITAL_COMMUNITY): Payer: Self-pay | Admitting: *Deleted

## 2017-10-10 ENCOUNTER — Encounter (HOSPITAL_COMMUNITY): Payer: Self-pay

## 2017-10-10 VITALS — BP 120/82 | Ht 63.0 in | Wt 200.0 lb

## 2017-10-10 DIAGNOSIS — N644 Mastodynia: Secondary | ICD-10-CM

## 2017-10-10 DIAGNOSIS — N6452 Nipple discharge: Secondary | ICD-10-CM

## 2017-10-10 DIAGNOSIS — Z01419 Encounter for gynecological examination (general) (routine) without abnormal findings: Secondary | ICD-10-CM

## 2017-10-10 DIAGNOSIS — N898 Other specified noninflammatory disorders of vagina: Secondary | ICD-10-CM

## 2017-10-10 NOTE — Progress Notes (Signed)
Complaints of right outer breast pain that radiates upward x one month. Patient states the pain comes and goes. Pain rates the pain at a 5-6 out of 10. Patient complained of bilateral breast discharge a few months ago that was a clear/whitish color when expresses.  Pap Smear: Pap smear completed today. Last Pap smear was 09/08/2016 at [facility] and normal with negative HPV. Per patient has a history of five abnormal Pap smears that a colposcopy was completed for follow-up including. The first abnormal Pap smear was 07/14/2008 and the other two were between the first abnormal and 2016. She had an abnormal Pap smear 08/27/2014 that was ASCUS HPV positive and 06/04/2015 that was ASCUS HPV positive. She had a colposcopy completed 10/22/2014 that showed CIN II with cryotherapy completed for follow up 11/20/2014 and a colposcopy 09/23/2015 that was benign. Last four Pap smear results along with the one 07/14/2008 are in EPIC. The last two colposcopy results and the one in 2010 are in EPIC along with the notes from her cryotherapy  Physical exam: Breasts Breasts symmetrical. No skin abnormalities bilateral breasts. No nipple retraction bilateral breasts. No nipple discharge right breast. Expressed a yellowish colored nipple discharge from the left breast on exam. Sample of discharge sent to cytology for evaluation. No lymphadenopathy. No lumps palpated bilateral breasts. Complaints of right outer breast tenderness on exam. Referred patient to the [facility] for a diagnostic mammogram. Appointment scheduled for Friday, Oct 20, 2017 at 0900.        Pelvic/Bimanual   Ext Genitalia No lesions, no swelling and thick white discharge observed on external genitalia.         Vagina Vagina pink and normal texture. No lesions and a small amount of thick white discharge observed in vagina. Wet prep completed.        Cervix Cervix is present. Cervix pink and of normal texture. Cervix friable. No discharge  observed.     Uterus Uterus is present and palpable. Uterus in normal position and normal size.        Adnexae Bilateral ovaries present and palpable. No tenderness on palpation.         Rectovaginal No rectal exam completed today since patient had no rectal complaints. No skin abnormalities observed on exam.    Smoking History: Patient quit smoking over a year ago.  Patient Navigation: Patient education provided. Access to services provided for patient through BCCCP program.   Breast and Cervical Cancer Risk Assessment: Patient has no family history of breast cancer, known genetic mutations, or radiation treatment to the chest before age 39. Patient has a history of cervical dysplasia. Patient has no history of being immunocompromised or DES exposure in-utero.

## 2017-10-10 NOTE — Patient Instructions (Signed)
Explained self breast awareness with Hetty Blend. Let patient know that if today's Pap smear is normal that her next Pap smear will be due in one year due to her history of abnormal Pap smears. Referred patient to the [facility] for a diagnostic mammogram. Appointment scheduled for Friday, Oct 20, 2017 at 0900. Patient aware of appointment and will be there. Let patient know will follow up with her within the next week with results of breast discharge, wet prep, and Pap smear by phone. Sophia Phillips verbalized understanding.  Tiane Szydlowski, Arvil Chaco, RN 8:56 AM

## 2017-10-11 ENCOUNTER — Encounter (HOSPITAL_COMMUNITY): Payer: Self-pay | Admitting: *Deleted

## 2017-10-12 LAB — CYTOLOGY - PAP
DIAGNOSIS: NEGATIVE
HPV: NOT DETECTED

## 2017-10-20 ENCOUNTER — Ambulatory Visit
Admission: RE | Admit: 2017-10-20 | Discharge: 2017-10-20 | Disposition: A | Discharge status: Home / Self Care | Payer: No Typology Code available for payment source | Source: Ambulatory Visit | Attending: Obstetrics and Gynecology | Admitting: Obstetrics and Gynecology

## 2017-10-20 ENCOUNTER — Ambulatory Visit: Payer: No Typology Code available for payment source

## 2017-10-20 DIAGNOSIS — N6452 Nipple discharge: Secondary | ICD-10-CM

## 2017-12-20 ENCOUNTER — Encounter (HOSPITAL_COMMUNITY): Payer: Self-pay | Admitting: *Deleted

## 2017-12-20 NOTE — Progress Notes (Signed)
Letter mailed to patient with negative pap smear results. HPV was negative. Next pap smear due in one year.

## 2018-12-11 ENCOUNTER — Other Ambulatory Visit (HOSPITAL_COMMUNITY): Payer: Self-pay | Admitting: *Deleted

## 2019-02-07 ENCOUNTER — Ambulatory Visit (HOSPITAL_COMMUNITY)
Admission: RE | Admit: 2019-02-07 | Discharge: 2019-02-07 | Disposition: A | Discharge status: Home / Self Care | Payer: Medicaid Other | Source: Ambulatory Visit | Attending: Obstetrics and Gynecology | Admitting: Obstetrics and Gynecology

## 2019-02-07 ENCOUNTER — Other Ambulatory Visit: Payer: Self-pay

## 2019-02-07 ENCOUNTER — Encounter (HOSPITAL_COMMUNITY): Payer: Self-pay

## 2019-02-07 DIAGNOSIS — R87611 Atypical squamous cells cannot exclude high grade squamous intraepithelial lesion on cytologic smear of cervix (ASC-H): Secondary | ICD-10-CM | POA: Insufficient documentation

## 2019-02-07 DIAGNOSIS — Z01419 Encounter for gynecological examination (general) (routine) without abnormal findings: Secondary | ICD-10-CM | POA: Insufficient documentation

## 2019-02-07 DIAGNOSIS — D219 Benign neoplasm of connective and other soft tissue, unspecified: Secondary | ICD-10-CM | POA: Insufficient documentation

## 2019-02-07 NOTE — Patient Instructions (Addendum)
Explained breast self awareness with Elsie Ra. Let patient know that her next Pap smear will be due based on the result of today's Pap smear. Let patient know will follow up with her within the next couple weeks with results of Pap smear by letter or phone. Let patient know that a screening mammogram is recommended at age 33 unless clinically indicated prior. Elsie Ra verbalized understanding.  Jamaree Hosier, Arvil Chaco, RN 1:29 PM

## 2019-02-07 NOTE — Progress Notes (Signed)
No complaints today.   Pap Smear: Pap smear completed today. Last Pap smear was 10/10/2017 at [facility] and normal with negative HPV. Patients previous Pap smear was 09/08/2016 at [facility] and normal with negative HPV. Per patient has a history offiveabnormal Pap smears that a colposcopy was completed for follow-up including. The first abnormal Pap smear was 07/14/2008 and the other two were between the first abnormal and 2016.She had an abnormal Pap smear 08/27/2014 that was ASCUS HPV positive and 06/04/2015 that was ASCUS HPV positive. She had acolposcopy completed 10/22/2014 that showed CIN II with cryotherapy completed for follow up 11/20/2014 and a colposcopy 09/23/2015 that was benign. LastfivePap smear results along with the one 07/14/2008 are in EPIC. The lasttwocolposcopy resultsand the one in 2010 are in [place] along with the notes from her cryotherapy  Physical exam: Breasts Breasts symmetrical. No skin abnormalities bilateral breasts. No nipple retraction bilateral breasts. No nipple discharge bilateral breasts. No lymphadenopathy. No lumps palpated bilateral breasts. No complaints of pain or tenderness on exam. Screening mammogram recommended at age 76 unless clinically indicated prior.            Pelvic/Bimanual   Ext Genitalia No lesions, no swelling and no discharge observed on external genitalia.         Vagina Vagina pink and normal texture. No lesions or discharge observed in vagina.          Cervix Cervix is present. Cervix pink and of normal texture. No discharge observed.     Uterus Uterus is present and palpable. Uterus in normal position and normal size.        Adnexae Bilateral ovaries present and palpable. Complaints of tenderness when palpated left ovary. Will refer to the Center [facility] for follow-up. Told patient appointment will not be covered by [facility] and that she will need to complete the [facility] financial assistance application.       Rectovaginal No rectal exam completed today since patient had no rectal complaints. No skin abnormalities observed on exam.    Smoking History: Patient is a former smoker that quit in 2019.  Patient Navigation: Patient education provided. Access to services provided for patient through BCCCP program.   Breast and Cervical Cancer Risk Assessment: Patient has no family history of breast cancer, known genetic mutations, or radiation treatment to the chest before age 24. Patient has history of cervical dysplasia. Patient has no history of being immunocompromised or DES exposure in-utero. Breast cancer risk assessment completed. No breast cancer risk calculated due to patient is less than 26 years old.

## 2019-02-11 LAB — CYTOLOGY - PAP: HPV: NOT DETECTED

## 2019-02-11 NOTE — Progress Notes (Signed)
I have sent for colpo appointment. Will contact patient when I have appointment time and date.  Thanks, Gabriel Cirri

## 2019-02-19 ENCOUNTER — Telehealth (HOSPITAL_COMMUNITY): Payer: Self-pay | Admitting: *Deleted

## 2019-02-19 NOTE — Telephone Encounter (Signed)
Telephoned patient at home number and left message to return call to [facility] 

## 2019-02-19 NOTE — Telephone Encounter (Signed)
Patient returned call to [facility]. Advised patient of abnormal pap smear results and need for colposcopy. Advised patient she is scheduled for [facility] Tuesday September 29 8:15. Patient voiced understanding.

## 2019-02-25 ENCOUNTER — Encounter (HOSPITAL_COMMUNITY): Payer: Self-pay | Admitting: *Deleted

## 2019-03-11 ENCOUNTER — Telehealth: Payer: Self-pay | Admitting: Obstetrics & Gynecology

## 2019-03-11 NOTE — Telephone Encounter (Signed)
Spoke to patient about her appointment on 9/29 @ 8:15. Patient instructed to wear a face mask for the entire appointment and no visitors are allowed with her during the visit. Patient screened for covid symptoms and denied having any.

## 2019-03-12 ENCOUNTER — Other Ambulatory Visit (HOSPITAL_COMMUNITY)
Admission: RE | Admit: 2019-03-12 | Discharge: 2019-03-12 | Disposition: A | Discharge status: Home / Self Care | Payer: Medicaid Other | Source: Ambulatory Visit | Attending: Obstetrics & Gynecology | Admitting: Obstetrics & Gynecology

## 2019-03-12 ENCOUNTER — Other Ambulatory Visit: Payer: Self-pay

## 2019-03-12 ENCOUNTER — Encounter: Payer: Self-pay | Admitting: Obstetrics & Gynecology

## 2019-03-12 ENCOUNTER — Ambulatory Visit (INDEPENDENT_AMBULATORY_CARE_PROVIDER_SITE_OTHER): Payer: Self-pay | Admitting: Obstetrics & Gynecology

## 2019-03-12 VITALS — BP 130/74 | HR 88 | Wt 190.0 lb

## 2019-03-12 DIAGNOSIS — R87611 Atypical squamous cells cannot exclude high grade squamous intraepithelial lesion on cytologic smear of cervix (ASC-H): Secondary | ICD-10-CM | POA: Insufficient documentation

## 2019-03-12 DIAGNOSIS — Z79899 Other long term (current) drug therapy: Secondary | ICD-10-CM

## 2019-03-12 DIAGNOSIS — R102 Pelvic and perineal pain: Secondary | ICD-10-CM

## 2019-03-12 LAB — POCT PREGNANCY, URINE: Preg Test, Ur: NEGATIVE

## 2019-03-12 NOTE — Progress Notes (Signed)
    GYNECOLOGY CLINIC COLPOSCOPY PROCEDURE NOTE  33 y.o. G0P0000 referred from [facility] for colposcopy for ASC cannot exclude high grade lesion [facility]) pap smear on 02/07/19.  History of CIN II in 2016, had cryotherapy and had normal subsequent pap smears until now. Discussed role for HPV in cervical dysplasia, need for surveillance. Patient was also noted to have left adnexal tenderness during her [facility] exam, needs further evaluation. Reports continued mild-moderate pain, no nausea, no fevers or other symptoms.   Patient given informed consent, signed copy in the chart, time out was performed.  Placed in lithotomy position. Cervix viewed with speculum and colposcope after application of acetic acid.   Colposcopy adequate? Yes Faint acetowhite lesion noted at 12 o'clock; corresponding biopsy obtained.  ECC specimen obtained. All specimens were labeled and sent to pathology. Left lower quadrant tenderness elicited on palpation of abdomen/low pelvis, ultrasound ordered for further evaluation  Patient was given post procedure instructions.  Will follow up pathology and ultrasound results and manage accordingly; patient will be contacted with results and recommendations.  Routine preventative health maintenance measures emphasized.    Verita Schneiders, MD, [place] Attending [place], [facility] for [employer], [place]

## 2019-03-12 NOTE — Patient Instructions (Signed)

## 2019-03-13 LAB — SURGICAL PATHOLOGY

## 2019-03-21 ENCOUNTER — Ambulatory Visit (HOSPITAL_COMMUNITY)
Admission: RE | Admit: 2019-03-21 | Discharge: 2019-03-21 | Disposition: A | Discharge status: Home / Self Care | Payer: Self-pay | Source: Ambulatory Visit | Attending: Obstetrics & Gynecology | Admitting: Obstetrics & Gynecology

## 2019-03-21 ENCOUNTER — Other Ambulatory Visit: Payer: Self-pay

## 2019-03-21 DIAGNOSIS — R102 Pelvic and perineal pain: Secondary | ICD-10-CM | POA: Insufficient documentation

## 2019-05-08 ENCOUNTER — Telehealth (HOSPITAL_COMMUNITY): Payer: Self-pay | Admitting: *Deleted

## 2019-05-08 NOTE — Telephone Encounter (Signed)
Patient sent three bills to our department. Called patient to discuss what is covered by BCCCP. Let patient know the one bill for her colposcopy that is $575.00 is covered by [facility] and have fixed. Let patient know the other two bills for an ultrasound on 03/21/2019 for pelvic pain is not covered by [facility]CP. Explained to patient what is covered by BCCCP. Told patient about the [place] and how to get the application to help with the other bills. The provider recommended 67-month follow-up Pap smear and co-testing from her colposcopy. Told patient she can either schedule with [facility] or the [facility] and that appointment would be covered. Explained to patient the follow-up from her U/S for the pelvic pain is not covered. Told patient that if she has any additional questions to call me. Patient verbalized understanding.

## 2019-07-15 ENCOUNTER — Telehealth (HOSPITAL_COMMUNITY): Payer: Self-pay

## 2019-07-15 NOTE — Telephone Encounter (Signed)
Attempted to contact patient, received message stating mailbox is full, will try again to reach patient.

## 2019-09-02 ENCOUNTER — Telehealth: Payer: Self-pay | Admitting: Family Medicine

## 2019-09-02 NOTE — Telephone Encounter (Signed)
The patient sent a message via mychart to schedule an 6 month f/u for an abnormal pap. The appointment is schedule, the last gyn checkout notes states return for any gyn concerns.

## 2019-09-03 ENCOUNTER — Telehealth: Payer: Self-pay | Admitting: Advanced Practice Midwife

## 2019-09-03 NOTE — Telephone Encounter (Signed)
The patient called in stating she would like the appointment scheduled sooner because she has some concerns. Rescheduled with the next available.

## 2019-10-02 ENCOUNTER — Encounter: Payer: Self-pay | Admitting: Obstetrics & Gynecology

## 2019-10-02 ENCOUNTER — Other Ambulatory Visit: Payer: Self-pay

## 2019-10-02 ENCOUNTER — Ambulatory Visit (INDEPENDENT_AMBULATORY_CARE_PROVIDER_SITE_OTHER): Payer: Self-pay | Admitting: Obstetrics & Gynecology

## 2019-10-02 ENCOUNTER — Other Ambulatory Visit (HOSPITAL_COMMUNITY)
Admission: RE | Admit: 2019-10-02 | Discharge: 2019-10-02 | Disposition: A | Discharge status: Home / Self Care | Payer: Medicaid Other | Source: Ambulatory Visit | Attending: Obstetrics & Gynecology | Admitting: Obstetrics & Gynecology

## 2019-10-02 VITALS — BP 141/85 | HR 80 | Ht 64.0 in | Wt 192.0 lb

## 2019-10-02 DIAGNOSIS — R87611 Atypical squamous cells cannot exclude high grade squamous intraepithelial lesion on cytologic smear of cervix (ASC-H): Secondary | ICD-10-CM

## 2019-10-02 DIAGNOSIS — N920 Excessive and frequent menstruation with regular cycle: Secondary | ICD-10-CM

## 2019-10-02 MED ORDER — NAPROXEN 500 MG PO TABS: 500.0000 mg | ORAL_TABLET | Freq: Two times a day (BID) | ORAL | 2 refills | Status: DC

## 2019-10-02 MED ORDER — TRANEXAMIC ACID 650 MG PO TABS: 1300.0000 mg | ORAL_TABLET | Freq: Three times a day (TID) | ORAL | 2 refills | Status: DC

## 2019-10-02 NOTE — Progress Notes (Signed)
   GYNECOLOGY OFFICE VISIT NOTE  History:   Sophia Phillips is a 34 y.o. G0P0000 here today for follow up pap smear. Had ASC-H pap in 02/07/19 and benign colposcopy afterwards. Referred from [facility].   Also reports heavier periods with clots, wants intervention.  She denies any abnormal vaginal discharge, pelvic pain or other concerns.    Past Medical History:  Diagnosis Date  . Anxiety   . Depression   . 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  [ ]  cryotherapy vs LEEP       Past Surgical History:  Procedure Laterality Date  . COLPOSCOPY    . WISDOM TOOTH EXTRACTION      The following portions of the patient's history were reviewed and updated as appropriate: allergies, current medications, past family history, past medical history, past social history, past surgical history and problem list.   Review of Systems:  Pertinent items noted in HPI and remainder of comprehensive ROS otherwise negative.  Physical Exam:  BP (!) 141/85   Pulse 80   Ht 5\' 4"  (1.626 m)   Wt 192 lb (87.1 kg)   LMP 09/26/2019   BMI 32.96 kg/m  CONSTITUTIONAL: Well-developed, well-nourished female in no acute distress.  SKIN: No rash noted. Not diaphoretic. No erythema. No pallor. MUSCULOSKELETAL: Normal range of motion. No edema noted. NEUROLOGIC: Alert and oriented to person, place, and time. Normal muscle tone coordination. No cranial nerve deficit noted. PSYCHIATRIC: Normal mood and affect. Normal behavior. Normal judgment and thought content. CARDIOVASCULAR: Normal heart rate noted RESPIRATORY: Effort and breath sounds normal, no problems with respiration noted ABDOMEN: No masses noted. No other overt distention noted.   PELVIC: Normal appearing external genitalia; normal urethral meatus; normal appearing vaginal mucosa and cervix.  Pap smear obtained. Bloody discharge noted, at end of cycle. Performed in the presence of a chaperone   Assessment and Plan:    1. Atypical squamous cells cannot exclude high grade lesion (ASC-H) pap on 02/07/19 - Cytology - PAP( [place]) done, will follow up results and manage accordingly.  This is done via the BCCCP program.  2. Menorrhagia with regular cycle Wants nonhormonal management, discussed NSAIDs and/or Lysteda. All questions answered. She will try Naproxen first, and then add Marshell Levan is needed. If this does not work, may need to consider hormonal intervention or surgical intervention for known fibroids.  - naproxen (NAPROSYN) 500 MG tablet; Take 1 tablet (500 mg total) by mouth 2 (two) times daily with a meal. As needed for pain  Dispense: 60 tablet; Refill: 2 - tranexamic acid (LYSTEDA) 650 MG TABS tablet; Take 2 tablets (1,300 mg total) by mouth 3 (three) times daily. Take during menses for a maximum of five days  Dispense: 30 tablet; Refill: 2  Routine preventative health maintenance measures emphasized. Please refer to After Visit Summary for other counseling recommendations.   Return for any gynecologic concerns.    Total face-to-face time with patient: 15 minutes.  Over 50% of encounter was spent on counseling and coordination of care.   Verita Schneiders, MD, [place] for [employer], [place]

## 2019-10-02 NOTE — Patient Instructions (Signed)
[employer]  Department of Social Services[place] [address], [place], [place] [address] [phone]   or  www.[place]gov[place]human-services/social-services **SNAP/EBT/ Other nutritional benefits  [place] DHHS-Public Health-WIC [address], [place], [place] [address] [phone]) 641-3214  or  https://[place].gov[place]/human-services/health-department **WIC for  women who are pregnant and postpartum, infants and children up to 5 years old  [employer] [facility] [address], [place], [place] [address] [phone]   or   www.theblessedtable.org  **Food pantry  [employer] [address], [place], [place] [address] [phone]   or   https://brotherkolbes.godaddysites.com  **Emergency food and prepared meals  [facility] [employer] [facility] [address], [place], [place] [address] [phone]   or   www.cedargrovetop.us **Food pantry  [facility] [employer] [facility] [address], [place], [place] [address] [phone]   or   www.facebook.com/[facility]-116430931718202 **Food pantry  [facility] [facility] [address], [place], [place] [address] [phone] **Food pantry  [facility] [address], [place], [place] [address] [phone]   or   www.greensborourbanministry.org  **Food pantry and prepared meals  [facility] [address], [place], [place] [address] https://jfsgreensboro.org/  **Food pantry  [employer] [employer] [facility] [address], [place], [place] [address] [phone]   or   www.lbcnow.org  **Food pantry  [employer] [address], [place], [place] [address] [phone]   or   http://www.onestepfurther.com **Food pantry, nutrition education, gardening activities  [employer] [facility] [address], [place], [place] [address] [phone] **Food pantry  [employer] [facility] [address], [place], [place] [address] [phone]   or   www.salvationarmyofg[place].org **Food pantry  [employer] [place] [address], [place], [place] [address] [phone]   or   http://senior-resources[place].org **Meals on Wheels Program  [facility] [employer] [address], [place], [place] [address] [phone]   or   www.stmattchurch.com  **Food pantry  [facility] [employer] [facility] [address], [place], [place] [address] [phone]   or   [facility].org **Food pantry     

## 2019-10-03 LAB — CYTOLOGY - PAP
Comment: NEGATIVE
Diagnosis: NEGATIVE
High risk HPV: NEGATIVE

## 2019-10-21 ENCOUNTER — Ambulatory Visit: Payer: Medicaid Other | Admitting: Obstetrics & Gynecology

## 2020-01-21 ENCOUNTER — Telehealth: Payer: Self-pay | Admitting: *Deleted

## 2020-01-21 NOTE — Telephone Encounter (Signed)
Patient left a voicemail complaining of AUB. Called patient back. Patient stated she has been bleeding the past 2.5 weeks and had large clots. Patient stated she did not get the [facility] prescription filled that was prescribed because it was over a $100 at [facility] and was unable to afford. She stated she tried to get it filled at [facility] at [place] due to the cost is much more affordable but the prescription had expired and was unable to get it filled. Told patient I will send a message to her provider and call her back. Patient verbalized understanding.  Sent message to Dr. Harolyn Rutherford and the prescription has been sent to [facility]. Dr. Harolyn Rutherford recommended patient to schedule an appointment at the [facility] for [place].   Called patient back and let her know the prescription has been sent to [facility]. Told patient that Dr. Harolyn Rutherford recommended her to schedule an appointment at the [facility] for [place]. Informed patient that the appointment is not covered by BCCCP. Explained the [place] and where to get the application. Let patient know that someone from the [facility] for [place] will call her to schedule appointment. Patient verbalized understanding.

## 2020-01-23 ENCOUNTER — Telehealth: Payer: Self-pay

## 2020-01-23 NOTE — Telephone Encounter (Signed)
Attempted to contact patient to schedule 6 month follow-up pap smear, received message-"mailbox full, cannot accept more messages."

## 2020-04-02 ENCOUNTER — Ambulatory Visit: Payer: Self-pay | Admitting: *Deleted

## 2020-04-02 ENCOUNTER — Other Ambulatory Visit: Payer: Self-pay

## 2020-04-02 VITALS — BP 122/90 | Wt 184.2 lb

## 2020-04-02 DIAGNOSIS — Z01419 Encounter for gynecological examination (general) (routine) without abnormal findings: Secondary | ICD-10-CM

## 2020-04-02 NOTE — Progress Notes (Signed)
Ms. Sophia Phillips is a 34 y.o. G0P0000 female who presents to [facility] clinic today with no complaints. Six month follow-up Pap smear recommended and last Pap smear was completed 10/02/2019.   Pap Smear: Pap smear completed today. Last Pap smear was 10/02/2019 at [facility] [place] and normal with negative HPV. Patients previous Pap smear was 02/07/2019 and was ASC-H and a colposcopy was completed for follow-up 03/12/2019 that was benign. Patients previous Pap smears 10/10/2017 was normal with negative HPV and 09/08/2016 was normal with negative HPV. Per patient has a history offiveother abnormal Pap smears that a colposcopy was completed for follow-up.The first abnormal Pap smear was 07/14/2008 and the other two were between the first abnormal and 2016.She had an abnormal Pap smear 08/27/2014 that was ASCUS HPV positive and 06/04/2015 that was ASCUS HPV positive.She had acolposcopy completed 10/22/2014 that showed CIN II with cryotherapy completed for follow up 11/20/2014 and a colposcopy 09/23/2015 that was benign. LastsevenPap smear results along with the one 07/14/2008 are in EPIC. The lastthreecolposcopy resultsand the one in 2010 are in [place] along with the notes from her cryotherapy.   Physical exam: Breasts Breasts symmetrical. No skin abnormalities bilateral breasts. No nipple retraction bilateral breasts. No nipple discharge bilateral breasts. No lymphadenopathy. No lumps palpated bilateral breasts. No complaints of pain or tenderness on exam. Screening mammogram recommended at age 9 unless clinically indicated prior.       Pelvic/Bimanual Ext Genitalia No lesions, no swelling and no discharge observed on external genitalia.        Vagina Vagina pink and normal texture. No lesions and thick white discharge observed in vagina. Wet prep completed.        Cervix Cervix is present. Cervix pink and of normal texture. Thick white discharge observed on cervical os.    Uterus Uterus is present and palpable. Uterus in normal position and slightly enlarged.        Adnexae Bilateral ovaries present and palpable. No tenderness on palpation.         Rectovaginal No rectal exam completed today since patient had no rectal complaints. No skin abnormalities observed on exam.     Smoking History: Patient is a former smoker that quit in 2019.   Patient Navigation: Patient education provided. Access to services provided for patient through BCCCP program.    Breast and Cervical Cancer Risk Assessment: Patient does not have family history of breast cancer, known genetic mutations, or radiation treatment to the chest before age 65. Patient has history of cervical dysplasia. Patient has no history of being immunocompromised or DES exposure in-utero.  Risk Assessment    Risk Scores      04/02/2020 02/07/2019   Last edited by: Royston Bake, CMA Rolena Infante H, LPN   5-year risk: 0.3 %    Lifetime risk: 9.8 %           A: [facility]CP exam with pap smear No complaints.   Loletta Parish, RN 04/02/2020 12:28 PM

## 2020-04-02 NOTE — Patient Instructions (Signed)
Explained breast self awareness with Sophia Phillips. Pap smear completed today. Let her know that if today's Pap smear is normal and HPV negative that her next Pap smear will be due in one year due to her history of abnormal Pap smears. Let patient know will follow up with her within the next week with results of Pap smear and wet prep by phone. Informed patient that a screening mammogram is recommended at age 34 unless clinically indicated prior. Sophia Phillips verbalized understanding.  Cecila Satcher, Arvil Chaco, RN 12:28 PM

## 2020-04-03 ENCOUNTER — Telehealth: Payer: Self-pay

## 2020-04-03 LAB — CYTOLOGY - PAP
Comment: NEGATIVE
Diagnosis: NEGATIVE
High risk HPV: NEGATIVE

## 2020-04-03 LAB — CERVICOVAGINAL ANCILLARY ONLY
Bacterial Vaginitis (gardnerella): POSITIVE — AB
Candida Glabrata: NEGATIVE
Candida Vaginitis: NEGATIVE
Comment: NEGATIVE
Comment: NEGATIVE
Comment: NEGATIVE
Comment: NEGATIVE
Trichomonas: NEGATIVE

## 2020-04-03 MED ORDER — METRONIDAZOLE 500 MG PO TABS: 500.0000 mg | ORAL_TABLET | Freq: Two times a day (BID) | ORAL | 0 refills | Status: DC

## 2020-04-03 NOTE — Telephone Encounter (Signed)
Patient informed wet prep results positive for Bacterial Vaginitis, needs rx Metronidazole (Flagyl) sent to pharmacy. Patient verbalized understanding

## 2020-04-06 ENCOUNTER — Other Ambulatory Visit: Payer: Self-pay

## 2020-04-06 ENCOUNTER — Telehealth: Payer: Self-pay

## 2020-04-06 DIAGNOSIS — B3731 Acute candidiasis of vulva and vagina: Secondary | ICD-10-CM

## 2020-04-06 DIAGNOSIS — B373 Candidiasis of vulva and vagina: Secondary | ICD-10-CM

## 2020-04-06 MED ORDER — FLUCONAZOLE 150 MG PO TABS: 150.0000 mg | ORAL_TABLET | Freq: Every day | ORAL | 0 refills | Status: AC

## 2020-04-06 NOTE — Telephone Encounter (Signed)
Spoke with patient to inform her that her pap smear was normal and that the HPV was negative. Informed patient that her next pap would be due in 1 year given her previous history of abnormals. Patient stated that she now has a yeast infection from the flagyl that was prescribed last week for BV. Patient asked if we could prescribe her something for the yeast. Informed patient that I would check with Etheleen Sia to see what the protocol would be since her wet prep did not show any yeast. Patient voiced understanding. Will call patient back once I speak with Christine or Dr. Elly Modena.

## 2020-04-06 NOTE — Telephone Encounter (Signed)
Called patient to inform her that Dr. Elly Modena did approve 1 time dose of diflucan (150 mg) to be called into patient's pharmacy. Instructed patient that the medication is a one time dose antifungal that should clear up symptoms associated with a vaginal yeast infection. Instructed patient to wait until she has completed the flagyl to take the diflucan. Patient voiced understanding. If symptoms are not resolved once medication has been taken patient will need to reach out to OB/GYN for follow-up.

## 2021-04-22 ENCOUNTER — Ambulatory Visit: Payer: Medicaid Other

## 2021-05-04 ENCOUNTER — Ambulatory Visit: Payer: Self-pay | Admitting: *Deleted

## 2021-05-04 ENCOUNTER — Ambulatory Visit: Payer: Medicaid Other

## 2021-05-04 ENCOUNTER — Other Ambulatory Visit: Payer: Self-pay

## 2021-05-04 VITALS — BP 120/74 | Wt 170.8 lb

## 2021-05-04 DIAGNOSIS — N644 Mastodynia: Secondary | ICD-10-CM

## 2021-05-04 DIAGNOSIS — N898 Other specified noninflammatory disorders of vagina: Secondary | ICD-10-CM

## 2021-05-04 DIAGNOSIS — N6452 Nipple discharge: Secondary | ICD-10-CM

## 2021-05-04 DIAGNOSIS — Z01419 Encounter for gynecological examination (general) (routine) without abnormal findings: Secondary | ICD-10-CM

## 2021-05-04 NOTE — Patient Instructions (Addendum)
Explained breast self awareness with Elsie Ra. Pap smear completed today. Let patient know if today's Pap smear is normal and HPV negative that her next Pap smear will be due in one year. Referred patient to the [facility] for a diagnostic mammogram. Appointment scheduled Thursday, May 27, 2021 at 1240. Patient aware of appointment and will be there. Let patient know will follow up with her within the next week with results of her Pap smear and wet prep by phone. Elsie Ra verbalized understanding.  Chyla Schlender, Arvil Chaco, RN 10:55 AM

## 2021-05-04 NOTE — Progress Notes (Signed)
Sophia Phillips is a 35 y.o. G0P0000 female who presents to [facility] clinic today with complaint of right inner/upper quadrant breast pain that is constant. Patient rates the pain at a 5-6 out of 10. Patient complained of left breast clear/milky discharge when expressed. Patient complained of breast discharge on previous exam 10/10/2017 that a sample was sent to cytology for evaluation that no malignant cells were identified.  Patient complained of heavy menstrual periods with large blood clots throughout menstrual period. Patient states she bleeds through pads less than one hour during cycle. Patient also complained of painful periods. Offered to refer patient to the [facility] for [place] for follow-up. Explained to patient that BCCCP will not cover the follow-up and the [place] Application. Patient stated she is familiar with the [place] Application process and has received in the past. Patient agreed to referral. Marjory Lies, LPN sent referral to the [facility] for [place].    Pap Smear: Pap smear completed today. Last Pap smear was 04/02/2020 at [facility] and was normal with negative HPV. Patients previous Pap smears were 10/02/2019 that was normal with negative HPV, 02/07/2019 and was ASC-H that a colposcopy was completed for follow-up 03/12/2019 that was benign, 10/10/2017 that was normal with negative HPV and 09/08/2016 was normal with negative HPV. Per patient has a history of five other abnormal Pap smears that a colposcopy was completed for follow-up.The first abnormal Pap smear was 07/14/2008 and the other two were between the first abnormal and 2016. She had an abnormal Pap smear 08/27/2014 that was ASCUS HPV positive and 06/04/2015 that was ASCUS HPV positive. She had a colposcopy completed 10/22/2014 that showed CIN II with cryotherapy completed for follow up 11/20/2014 and a colposcopy 09/23/2015 that was benign.  Last eight Pap smear results along with the one 07/14/2008 are in EPIC. The last three colposcopy results and the one in 2010 are in EPIC along with the notes from her cryotherapy.   Physical exam: Breasts Breasts symmetrical. No skin abnormalities bilateral breasts. No nipple retraction bilateral breasts. No nipple discharge bilateral breasts. Unable to express any nipple discharge from bilateral breast on exam. No lymphadenopathy. No lumps palpated bilateral breasts. Complaints of right outer and right breast pain between 12 o'clock-3 o'clock 5 cm from the nipple on exam.  MM DIAG BREAST TOMO BILATERAL  Result Date: 10/20/2017 CLINICAL DATA:  Patient reports bilateral yellow discharge only when expressed. Bilateral diffuse breast pain. EXAM: DIGITAL DIAGNOSTIC BILATERAL MAMMOGRAM WITH CAD AND TOMO COMPARISON:  Baseline mammogram ACR Breast Density Category b: There are scattered areas of fibroglandular density. FINDINGS: No suspicious mass, distortion, or microcalcifications are identified to suggest presence of malignancy. Mammographic images were processed with CAD. IMPRESSION: No mammographic evidence for malignancy. Type of discharge of the patient describes is benign. No specific follow-up is necessary. RECOMMENDATION: Screening mammogram at age 44 unless there are persistent or intervening clinical concerns. (Code:SM-B-40A) I have discussed the findings and recommendations with the patient. Results were also provided in writing at the conclusion of the visit. If applicable, a reminder letter will be sent to the patient regarding the next appointment. BI-RADS CATEGORY  1: Negative. Electronically Signed   By: Nolon Nations M.D.   On: 10/20/2017 10:09         Pelvic/Bimanual Ext Genitalia No lesions, no swelling and no discharge observed on external genitalia.        Vagina Vagina pink and normal texture.  No lesions and thick yellow discharge observed in vagina. Wet prep completed.       Cervix Cervix is present. Cervix pink and of normal texture. Thick yellow discharge observed on cervix.   Uterus Uterus is present and palpable. Uterus in normal position and enlarged.       Adnexae Bilateral ovaries present and palpable. No tenderness on palpation.         Rectovaginal No rectal exam completed today since patient had no rectal complaints. No skin abnormalities observed on exam.     Smoking History: Patient is a former smoker that quit in 2019.   Patient Navigation: Patient education provided. Access to services provided for patient through BCCCP program.    Breast and Cervical Cancer Risk Assessment: Patient does not have family history of breast cancer, known genetic mutations, or radiation treatment to the chest before age 26. Patient has history of cervical dysplasia. Patient has no history of being immunocompromised or DES exposure in-utero.  Risk Assessment     Risk Scores       05/04/2021 04/02/2020   Last edited by: Demetrius Revel, LPN Sophia Phillips, CMA   5-year risk: 0.4 % 0.3 %   Lifetime risk: 9.8 % 9.8 %            A: BCCCP exam with pap smear Complaint of right breast pain and left breast discharge.  P: Referred patient to the [facility] for a diagnostic mammogram. Appointment scheduled Thursday, May 27, 2021 at 1240.  Loletta Parish, RN 05/04/2021 10:55 AM

## 2021-05-05 LAB — CERVICOVAGINAL ANCILLARY ONLY
Bacterial Vaginitis (gardnerella): NEGATIVE
Candida Glabrata: NEGATIVE
Candida Vaginitis: NEGATIVE
Comment: NEGATIVE
Comment: NEGATIVE
Comment: NEGATIVE
Comment: NEGATIVE
Trichomonas: NEGATIVE

## 2021-05-10 ENCOUNTER — Telehealth: Payer: Self-pay

## 2021-05-10 LAB — CYTOLOGY - PAP
Comment: NEGATIVE
Diagnosis: NEGATIVE
High risk HPV: NEGATIVE

## 2021-05-10 NOTE — Telephone Encounter (Signed)
Patient informed negative Pap/HPV, wet prep results, next pap due in 1 year. Patient also asked about referral [facility] (Dr. Harolyn Rutherford), informed referral was sent, call back if no reply within 1 week. Patient verbalized understanding.

## 2021-05-27 ENCOUNTER — Ambulatory Visit
Admission: RE | Admit: 2021-05-27 | Discharge: 2021-05-27 | Disposition: A | Discharge status: Home / Self Care | Payer: No Typology Code available for payment source | Source: Ambulatory Visit | Attending: Obstetrics and Gynecology | Admitting: Obstetrics and Gynecology

## 2021-05-27 ENCOUNTER — Ambulatory Visit: Admission: RE | Admit: 2021-05-27 | Payer: Medicaid Other | Source: Ambulatory Visit

## 2021-05-27 ENCOUNTER — Other Ambulatory Visit: Payer: Self-pay

## 2021-05-27 DIAGNOSIS — N6452 Nipple discharge: Secondary | ICD-10-CM

## 2021-05-27 DIAGNOSIS — N644 Mastodynia: Secondary | ICD-10-CM

## 2021-06-23 ENCOUNTER — Ambulatory Visit: Payer: Medicaid Other | Admitting: Obstetrics and Gynecology

## 2022-03-09 ENCOUNTER — Other Ambulatory Visit: Payer: Self-pay | Admitting: Obstetrics and Gynecology

## 2022-03-09 DIAGNOSIS — R1011 Right upper quadrant pain: Secondary | ICD-10-CM

## 2022-03-09 DIAGNOSIS — R1030 Lower abdominal pain, unspecified: Secondary | ICD-10-CM

## 2022-03-17 ENCOUNTER — Inpatient Hospital Stay: Admission: RE | Admit: 2022-03-17 | Payer: No Typology Code available for payment source | Source: Ambulatory Visit

## 2022-03-18 ENCOUNTER — Other Ambulatory Visit: Payer: Self-pay | Admitting: Nurse Practitioner

## 2022-03-18 DIAGNOSIS — R109 Unspecified abdominal pain: Secondary | ICD-10-CM

## 2022-03-18 DIAGNOSIS — R8789 Other abnormal findings in specimens from female genital organs: Secondary | ICD-10-CM

## 2022-03-18 DIAGNOSIS — K5900 Constipation, unspecified: Secondary | ICD-10-CM

## 2022-03-18 DIAGNOSIS — R1030 Lower abdominal pain, unspecified: Secondary | ICD-10-CM

## 2022-03-18 DIAGNOSIS — R140 Abdominal distension (gaseous): Secondary | ICD-10-CM

## 2022-03-22 ENCOUNTER — Ambulatory Visit
Admission: RE | Admit: 2022-03-22 | Discharge: 2022-03-22 | Disposition: A | Discharge status: Home / Self Care | Payer: Medicaid Other | Source: Ambulatory Visit | Attending: Obstetrics and Gynecology | Admitting: Obstetrics and Gynecology

## 2022-03-22 ENCOUNTER — Other Ambulatory Visit: Payer: Self-pay | Admitting: Obstetrics and Gynecology

## 2022-03-22 DIAGNOSIS — R1030 Lower abdominal pain, unspecified: Secondary | ICD-10-CM

## 2022-03-22 DIAGNOSIS — R1011 Right upper quadrant pain: Secondary | ICD-10-CM

## 2022-03-22 MED ORDER — IOPAMIDOL (ISOVUE-300) INJECTION 61%: 100.0000 mL | Freq: Once | INTRAVENOUS | Status: DC | PRN

## 2022-03-29 ENCOUNTER — Ambulatory Visit
Admission: RE | Admit: 2022-03-29 | Discharge: 2022-03-29 | Disposition: A | Discharge status: Home / Self Care | Payer: Medicaid Other | Source: Ambulatory Visit | Attending: Nurse Practitioner | Admitting: Nurse Practitioner

## 2022-03-29 DIAGNOSIS — R140 Abdominal distension (gaseous): Secondary | ICD-10-CM

## 2022-03-29 DIAGNOSIS — K5900 Constipation, unspecified: Secondary | ICD-10-CM

## 2022-03-29 DIAGNOSIS — R109 Unspecified abdominal pain: Secondary | ICD-10-CM

## 2022-03-29 DIAGNOSIS — R1030 Lower abdominal pain, unspecified: Secondary | ICD-10-CM

## 2022-03-29 DIAGNOSIS — R8789 Other abnormal findings in specimens from female genital organs: Secondary | ICD-10-CM

## 2022-05-16 ENCOUNTER — Other Ambulatory Visit: Payer: Self-pay | Admitting: Obstetrics and Gynecology

## 2022-05-16 DIAGNOSIS — Z1231 Encounter for screening mammogram for malignant neoplasm of breast: Secondary | ICD-10-CM

## 2022-06-02 ENCOUNTER — Other Ambulatory Visit (HOSPITAL_COMMUNITY)
Admission: RE | Admit: 2022-06-02 | Discharge: 2022-06-02 | Disposition: A | Discharge status: Home / Self Care | Payer: Medicaid Other | Source: Ambulatory Visit | Attending: Obstetrics and Gynecology | Admitting: Obstetrics and Gynecology

## 2022-06-02 ENCOUNTER — Ambulatory Visit: Payer: Medicaid Other | Admitting: Hematology and Oncology

## 2022-06-02 ENCOUNTER — Ambulatory Visit: Payer: Medicaid Other

## 2022-06-02 VITALS — Wt 167.0 lb

## 2022-06-02 DIAGNOSIS — Z124 Encounter for screening for malignant neoplasm of cervix: Secondary | ICD-10-CM | POA: Diagnosis present

## 2022-06-02 NOTE — Progress Notes (Signed)
Patient: SPENSER CONG           Date of Birth: Aug 19, 1985           MRN: [id number] Visit Date: 06/02/2022 PCP: Patient, No Pcp Per     Cervical Exam  Abnormal Observations: Normal exam Recommendations: Repeat Pap smear in 5 years if normal. 3 negatives since ASCUS in 2016.    Patient's History Patient Active Problem List   Diagnosis Date Noted  . Fibroids 02/07/2019  . Atypical squamous cells cannot exclude high grade lesion (ASC-H) pap on 02/07/19 02/07/2019  . 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 ASCUS with positive high risk HPV cervical on 08/12/14 and 05/2015 08/12/2014   Past Medical History:  Diagnosis Date  . Anxiety   . Depression   . 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  '[ ]'$  cryotherapy vs LEEP       Family History  Problem Relation Age of Onset  . Hypertension Mother   . Hypertension Father   . Diabetes Father   . Hypertension Sister   . Diabetes Paternal Grandmother   . Breast cancer Neg Hx     Social History   Occupational History  . Not on file  Tobacco Use  . Smoking status: Former  . Smokeless tobacco: Never  . Tobacco comments:    vapor occasioanlly  Vaping Use  . Vaping Use: Some days  Substance and Sexual Activity  . Alcohol use: Yes    Comment: socially  . Drug use: No  . Sexual activity: Not Currently    Birth control/protection: Condom

## 2022-06-08 ENCOUNTER — Telehealth: Payer: Self-pay

## 2022-06-08 LAB — CYTOLOGY - PAP
Adequacy: ABSENT
Comment: NEGATIVE
Diagnosis: NEGATIVE
High risk HPV: NEGATIVE

## 2022-06-08 NOTE — Telephone Encounter (Signed)
I have spoken with the pt and advised of her negative PAP and HPV results. Pt has also been advised given this is her 3rd negative results, her next PAP will be in 5 years. Pt does have reservations with waiting 5 years. At the time of her office visit, pt was made aware that she has [place] Medicaid now and would need to establish with a Gynecologist. I have reiterated this to her and advised that she can discuss her reservations and have a more in depth conversation regarding the ACOG 5 year recommendation for repeat PAP as well as her ongoing heavy and painful menstruals.   Of note; pt had same complaints of heavy and painful menstruals during her last visit on 05/04/21: "Patient complained of heavy menstrual periods with large blood clots throughout menstrual period. Patient states she bleeds through pads less than one hour during cycle. Patient also complained of painful periods. Offered to refer patient to the [facility] for [place] for follow-up. Explained to patient that BCCCP will not cover the follow-up and the [place] Application".  I again, reiterated that she contacts [place] for [facility] at [phone] to establish care as previously discussed. Pt expressed understanding of this information.

## 2022-10-28 ENCOUNTER — Encounter: Payer: Self-pay | Admitting: Obstetrics & Gynecology

## 2022-10-28 ENCOUNTER — Ambulatory Visit (INDEPENDENT_AMBULATORY_CARE_PROVIDER_SITE_OTHER): Payer: Medicaid Other | Admitting: Obstetrics & Gynecology

## 2022-10-28 VITALS — BP 114/72 | HR 87 | Wt 164.0 lb

## 2022-10-28 DIAGNOSIS — N920 Excessive and frequent menstruation with regular cycle: Secondary | ICD-10-CM | POA: Diagnosis not present

## 2022-10-28 DIAGNOSIS — D500 Iron deficiency anemia secondary to blood loss (chronic): Secondary | ICD-10-CM

## 2022-10-28 DIAGNOSIS — D219 Benign neoplasm of connective and other soft tissue, unspecified: Secondary | ICD-10-CM | POA: Diagnosis not present

## 2022-10-28 MED ORDER — MELOXICAM 7.5 MG PO TABS: 7.5000 mg | ORAL_TABLET | Freq: Two times a day (BID) | ORAL | 2 refills | Status: DC

## 2022-10-28 MED ORDER — TRANEXAMIC ACID 650 MG PO TABS: 1300.0000 mg | ORAL_TABLET | Freq: Three times a day (TID) | ORAL | 2 refills | Status: AC

## 2022-10-28 NOTE — Progress Notes (Signed)
GYNECOLOGY OFFICE VISIT NOTE  History:   Sophia Phillips is a 37 y.o. G0 here today for discussion of management of symptomatic uterine fibroids. Last scan in 03/29/2022 showed multiple intramural fibroids, largest was around 5.8 cm (see report below).  Reports pain with fibroids and pain during periods.  Also reports heavy periods that has caused iron deficiency anemia in the past,  takes Meloxicam to help with that which also helps her pain.  She has never been pregnant, wants to retain her fertility.  Wants to discuss options for management.  She denies any abnormal vaginal discharge, bleeding, pelvic pain or other concerns.    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  [ ]  cryotherapy vs LEEP       Past Surgical History:  Procedure Laterality Date   COLPOSCOPY     WISDOM TOOTH EXTRACTION      The following portions of the patient's history were reviewed and updated as appropriate: allergies, current medications, past family history, past medical history, past social history, past surgical history and problem list.   Health Maintenance:  Normal pap and negative HRHPV on 06/02/2022.    Review of Systems:  Pertinent items noted in HPI and remainder of comprehensive ROS otherwise negative.  Physical Exam:  BP 114/72   Pulse 87   Wt 164 lb (74.4 kg)   BMI 28.15 kg/m  CONSTITUTIONAL: Well-developed, well-nourished female in no acute distress.  HEENT:  Normocephalic, atraumatic. External right and left ear normal. No scleral icterus.  NECK: Normal range of motion, supple, no masses noted on observation SKIN: No rash noted. Not diaphoretic. No erythema. No pallor. MUSCULOSKELETAL: Normal range of motion. No edema noted. NEUROLOGIC: Alert and oriented to person, place, and time. Normal muscle tone coordination. No cranial nerve deficit  noted. PSYCHIATRIC: Normal mood and affect. Normal behavior. Normal judgment and thought content. CARDIOVASCULAR: Normal heart rate noted RESPIRATORY: Effort and breath sounds normal, no problems with respiration noted ABDOMEN: No masses noted. No other overt distention noted.   PELVIC: Deferred  Labs and Imaging 03/29/2022  TRANSABDOMINAL AND TRANSVAGINAL ULTRASOUND OF PELVIS CLINICAL DATA:  Initial evaluation for abdominal pain, bloating. TECHNIQUE: Both transabdominal and transvaginal ultrasound examinations of the pelvis were performed. Transabdominal technique was performed for global imaging of the pelvis including uterus, ovaries, adnexal regions, and pelvic cul-de-sac. It was necessary to proceed with endovaginal exam following the transabdominal exam to visualize the endometrium.  COMPARISON:  CT from 03/22/2022.  FINDINGS: Uterus  Measurements: 12.6 x 8.6 x 8.2 cm = volume: 461.6 mL. Uterus is anteverted with multiple fibroids seen as follows;  1. 5.8 x 4.9 x 5.4 cm intramural fibroid present at the anterior uterine fundus. 2. 2.6 x 2.3 x 2.7 cm intramural fibroid present at the mid posterior uterine body. 3. 2.3 x 2.0 x 2.1 cm subserosal to exophytic fibroid extends from the posterior uterine fundus. 4. 3.3 x 2.8 x 2.7 cm intramural fibroid present at the central uterine fundus.  Endometrium  Thickness: 6.3 mm.  No focal abnormality visualized.  Right ovary  Not visualized.  No adnexal mass.  Left ovary  Measurements: 5.9 x 2.4 x 2.4 cm = volume: 18.2 mL. 3 cm simple cyst, likely a benign follicular cyst. No other adnexal mass.  Other findings  No abnormal free fluid.  IMPRESSION: 1. Enlarged fibroid  uterus as detailed above. 2. 3 cm simple left ovarian cyst, likely a physiologic follicular cyst. This is almost certainly benign given size, appearance, and patient age, with no follow-up imaging recommended. Note: This recommendation does not apply to premenarchal  patients or to those with increased risk (genetic, family history, elevated tumor markers or other high-risk factors) of ovarian cancer. Reference: Radiology 2019 Nov; 293(2):359-371. 3. Nonvisualization of the right ovary. No other adnexal mass or free fluid. 4. Normal endometrium.    Electronically Signed   By: Rise Mu M.D.   On: 03/30/2022 05:47       Assessment and Plan:     1. Fibroids 2. Menorrhagia with regular cycle 3. Iron deficiency anemia due to chronic blood loss Reviewed ultrasound results and fibroid locations.  Discussed fertility sparing management options:  discussed continued use of Meloxicam, but recommended addition of Lysteda to help reduce bleeding.   Also discussed hormonal therapy (OCPs, progestin methods to reduce/stop bleeding) vs MyFembree (this actually targets the fibroids too in addition to bleeding effect).  Also discussed Sonata procedure and myomectomy (removal of fibroids).  Information given to her to review. Had lengthy discussion about these options, all questions answered. She is interested in MyFembree vs Sonata for long term management, but okay with addition of Lysteda to Meloxicam for now. Bleeding precautions reviewed.  Meloxicam refilled, Lysteda prescribed.  - tranexamic acid (LYSTEDA) 650 MG TABS tablet; Take 2 tablets (1,300 mg total) by mouth 3 (three) times daily. Take during menses for a maximum of five days  Dispense: 30 tablet; Refill: 2 - meloxicam (MOBIC) 7.5 MG tablet; Take 1 tablet (7.5 mg total) by mouth 2 (two) times daily with a meal. Can take as needed during period  Dispense: 30 tablet; Refill: 2  Please refer to After Visit Summary for other counseling recommendations.   Return for follow up as recommended.    I spent  45  minutes dedicated to the care of this patient including pre-visit review of records, face to face time with the patient discussing her conditions and treatments and post visit orders.    Jaynie Collins, MD, FACOG Obstetrician & Gynecologist, [facility] for [employer], [facility]

## 2022-10-28 NOTE — Patient Instructions (Signed)
Lysteda and Meloxicam  Hormone therapy  MyFembree  [employer]

## 2022-11-09 ENCOUNTER — Encounter: Payer: Medicaid Other | Admitting: Obstetrics & Gynecology

## 2023-01-21 IMAGING — MG DIGITAL DIAGNOSTIC BILAT W/ TOMO W/ CAD
6 of 10 series · 6 of 30 positions shown · non-contrast
Comparison: Previous exam(s).

CLINICAL DATA: Patient presents with diffuse and waxing waning
right breast pain. No reported lumps. She also reports left nipple
discharge, both spontaneous and non spontaneous, mostly yellowish.
She was evaluated in October 2017 for bilateral nipple discharge with
negative imaging results. Her left breast discharge is less
pronounced than it was in 0981, but otherwise similar in character.

EXAM:
DIGITAL DIAGNOSTIC BILATERAL MAMMOGRAM WITH TOMOSYNTHESIS AND CAD;
ULTRASOUND LEFT BREAST LIMITED
TECHNIQUE: Bilateral digital diagnostic mammography and breast tomosynthesis
was performed. The images were evaluated with computer-aided
detection.; Targeted ultrasound examination of the left breast was
performed.

[L MLO synth-2D (1 of 2)]
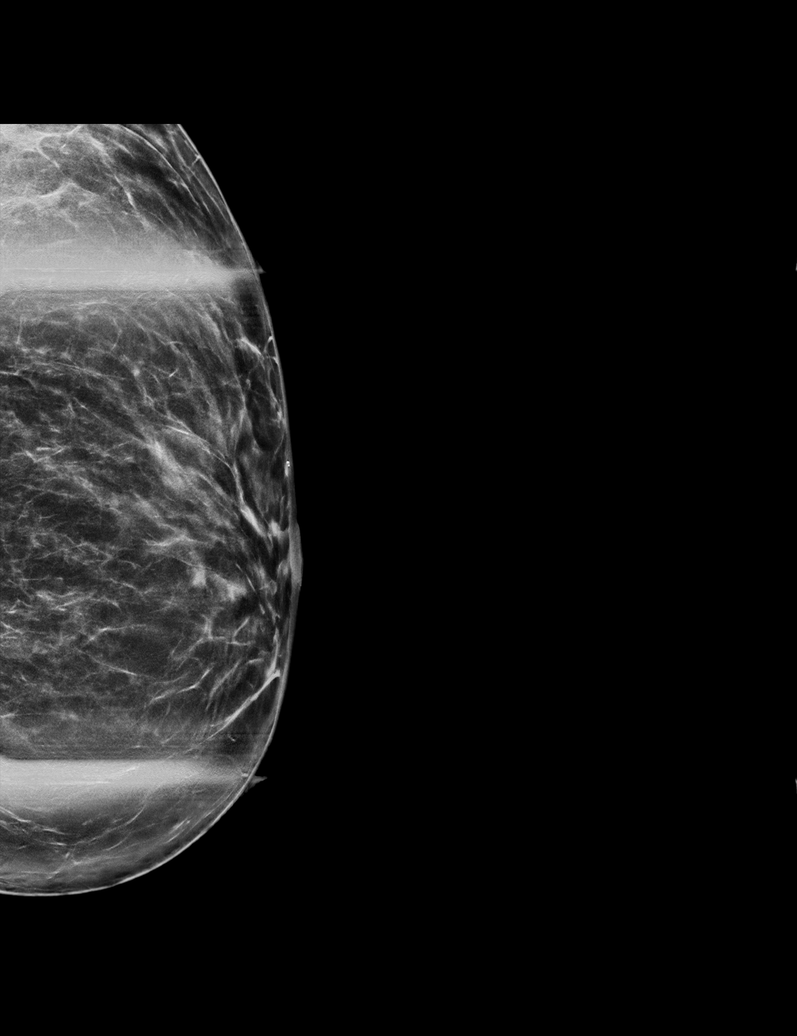

[L CC synth-2D]
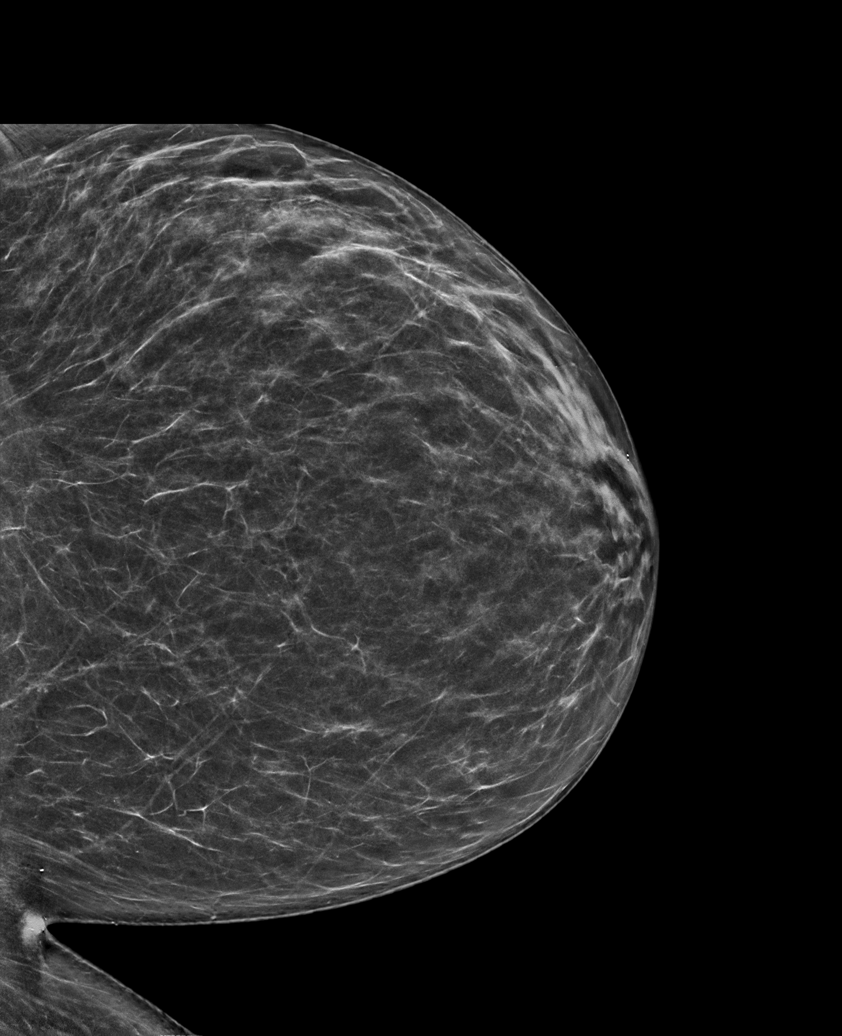

[R MLO synth-2D]
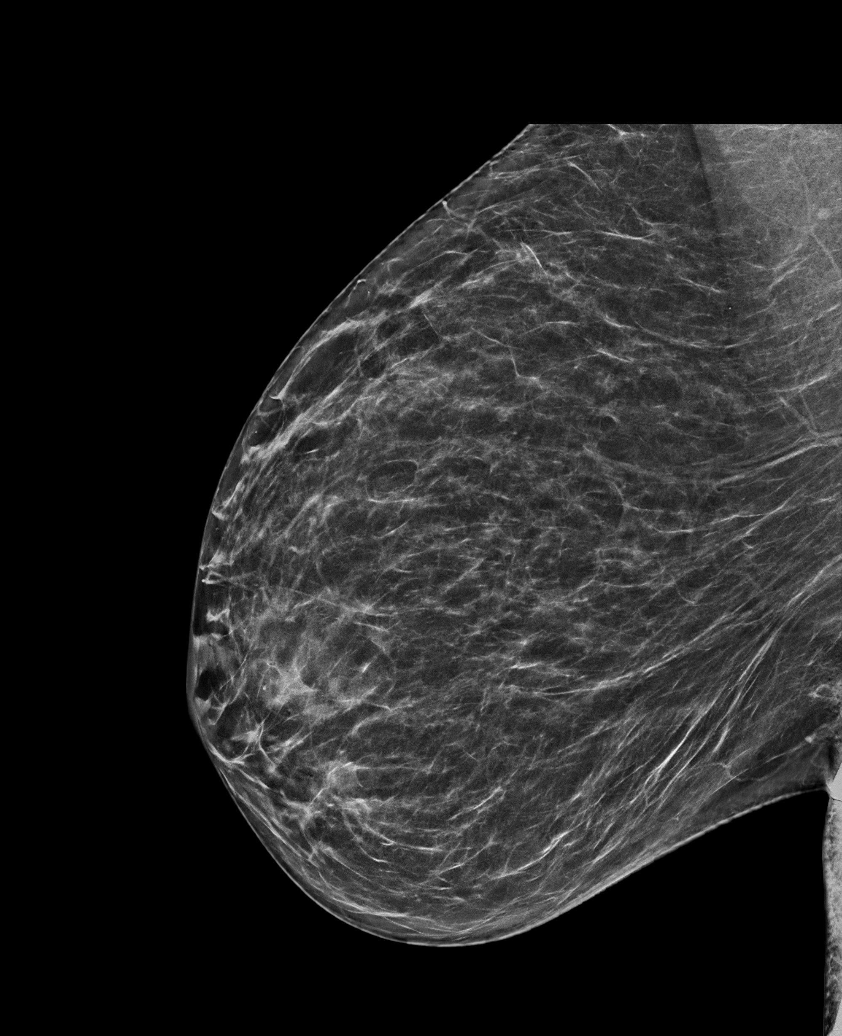

[L MLO synth-2D (2 of 2)]
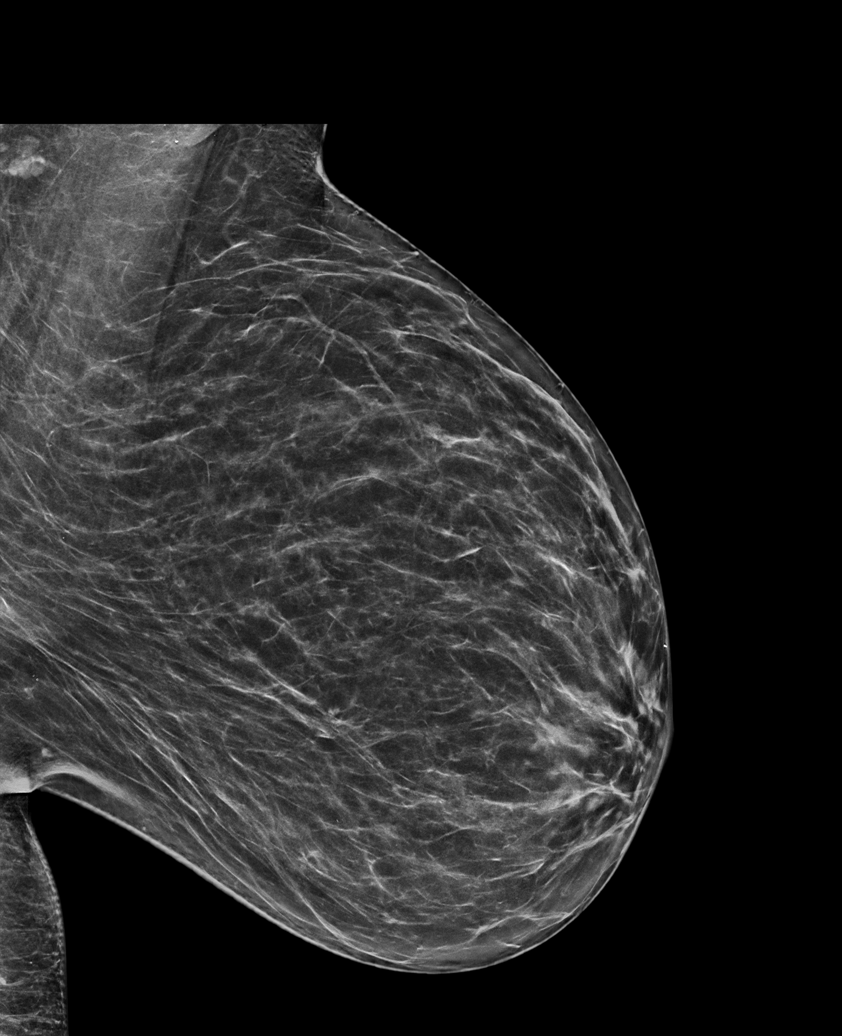

[R CC synth-2D]
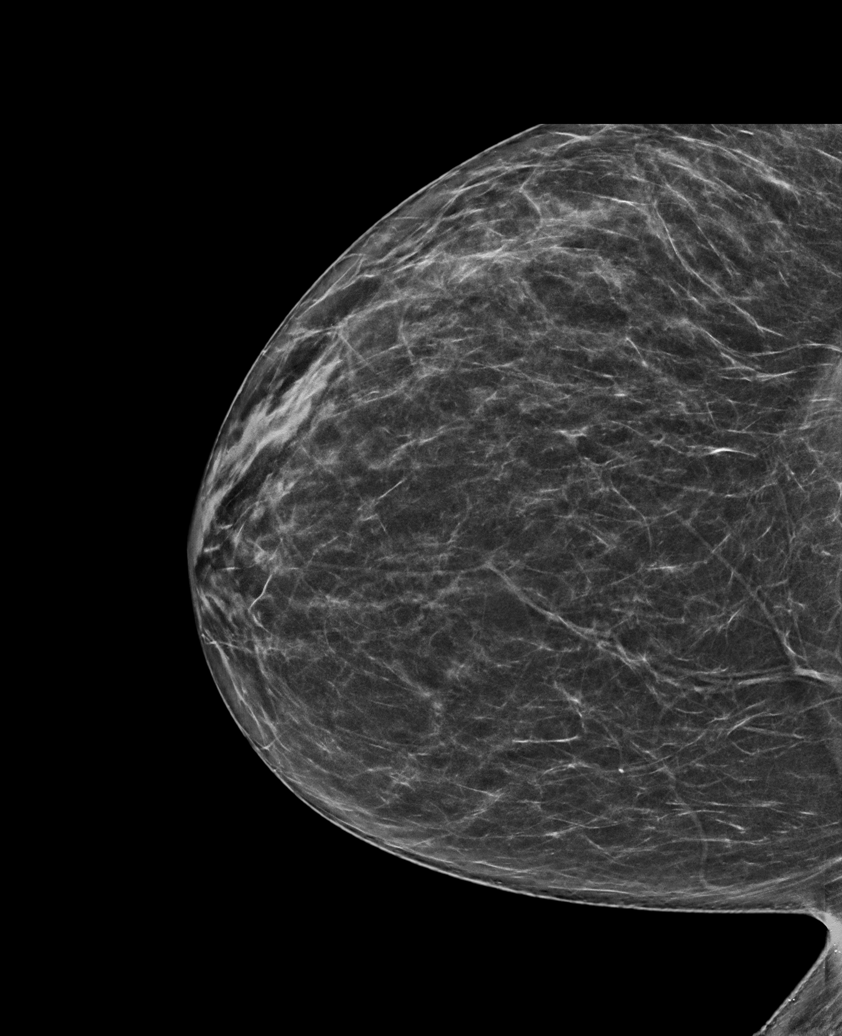

[R CC tomo · tomo slice 33/65.0]
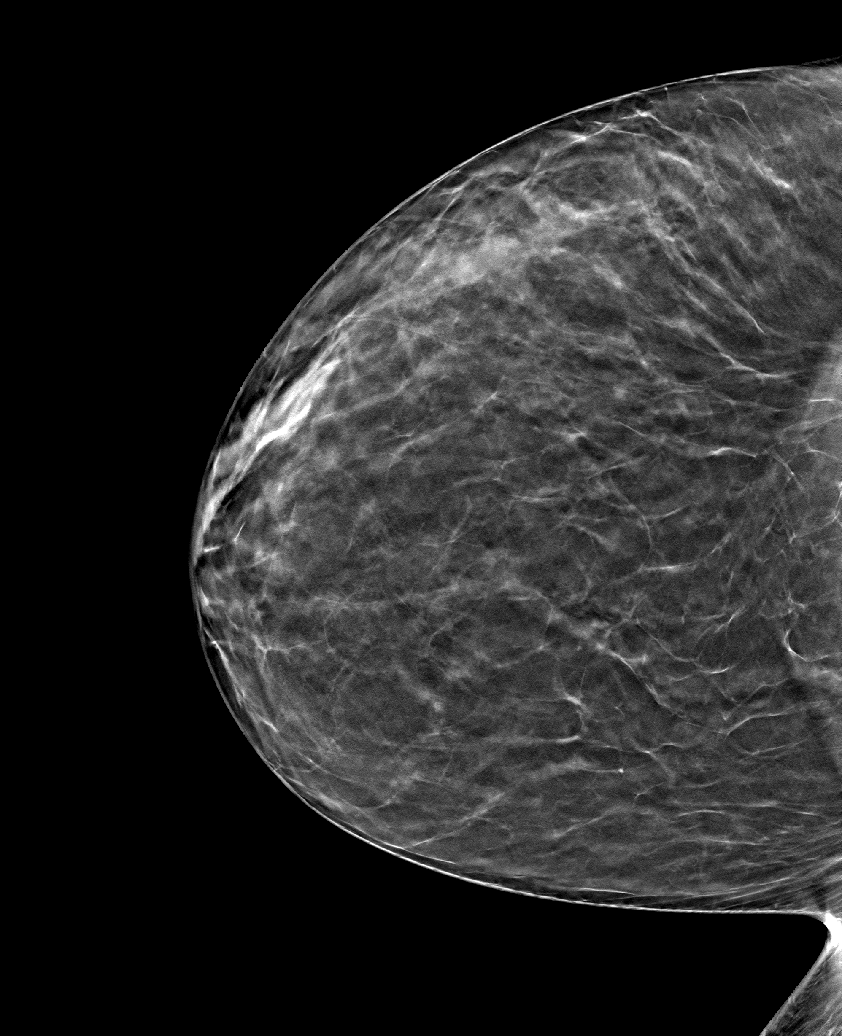

[6 of 30 positions shown; findings below may reference images not displayed]

ACR Breast Density Category b: There are scattered areas of
fibroglandular density.
FINDINGS: There are no masses, areas of architectural distortion, areas of
significant asymmetry or suspicious calcifications. No mammographic
change.

On physical exam, no nipple discharge is evident on the left.

Targeted left breast ultrasound is performed, showing normal tissue
in the retroareolar left breast. No masses. No dilated ducts.
IMPRESSION: 1. No evidence of breast malignancy.
2. Benign chronic left nipple discharge.

RECOMMENDATION:
Screening mammogram at age 39 unless there are persistent or
intervening clinical concerns. (Code:[id number])

If the patient's nipple discharge changes in character, particularly
if becomes more spontaneous, clear or bloody, repeat imaging
assessment would be indicated with possible surgical referral is
imaging is negative.

I have discussed the findings and recommendations with the patient.
If applicable, a reminder letter will be sent to the patient
regarding the next appointment.

BI-RADS CATEGORY  1: Negative.

## 2023-06-22 ENCOUNTER — Encounter: Payer: Self-pay | Admitting: Obstetrics & Gynecology

## 2023-06-22 ENCOUNTER — Other Ambulatory Visit (HOSPITAL_COMMUNITY)
Admission: RE | Admit: 2023-06-22 | Discharge: 2023-06-22 | Disposition: A | Discharge status: Home / Self Care | Payer: Medicaid Other | Source: Ambulatory Visit | Attending: Obstetrics & Gynecology | Admitting: Obstetrics & Gynecology

## 2023-06-22 ENCOUNTER — Ambulatory Visit: Payer: Medicaid Other | Admitting: Obstetrics & Gynecology

## 2023-06-22 VITALS — BP 128/83 | HR 73 | Ht 64.0 in | Wt 171.0 lb

## 2023-06-22 DIAGNOSIS — Z113 Encounter for screening for infections with a predominantly sexual mode of transmission: Secondary | ICD-10-CM | POA: Diagnosis not present

## 2023-06-22 DIAGNOSIS — Z01419 Encounter for gynecological examination (general) (routine) without abnormal findings: Secondary | ICD-10-CM | POA: Diagnosis present

## 2023-06-22 DIAGNOSIS — N898 Other specified noninflammatory disorders of vagina: Secondary | ICD-10-CM | POA: Insufficient documentation

## 2023-06-22 DIAGNOSIS — D219 Benign neoplasm of connective and other soft tissue, unspecified: Secondary | ICD-10-CM | POA: Diagnosis not present

## 2023-06-22 NOTE — Progress Notes (Signed)
 Patient presents for Annual.     GYNECOLOGY ANNUAL PREVENTATIVE CARE ENCOUNTER NOTE  History:     Sophia Phillips is a 38 y.o. G0P0000 female here for a routine annual gynecologic exam.  Current complaints: vaginal discharge, wants evaluation and STI screen.  Had recent negative serum screen. Also wanted to discuss fibroid management. Reports increased pelvic pressure, especially on her right side, presses on her side and bladder a lot.  Bleeding is not as heavy as it was in the past, has regular periods that went from being 6-7 days long to 3-4 days long now.  Just worried about pressure symptoms, and also future fertility. Wants ultrasound to evaluate current state of fibroids.   Denies problems with intercourse or other gynecologic concerns.    Gynecologic History Patient's last menstrual period was 06/05/2023 (exact date). Contraception: abstinence Last Pap: 06/02/2022. Result was normal with negative HPV Last Mammogram: 05/29/2023.  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  [ ]  cryotherapy vs LEEP       Past Surgical History:  Procedure Laterality Date   COLPOSCOPY     WISDOM TOOTH EXTRACTION      Current Outpatient Medications on File Prior to Visit  Medication Sig Dispense Refill   ASHWAGANDHA PO Take by mouth.     BLACK CURRANT SEED OIL PO Take by mouth.     calcium-vitamin D 250-100 MG-UNIT tablet Take 1 tablet by mouth 2 (two) times daily.     DANDELION PO Take by mouth.     Flaxseed, Linseed, (FLAX SEED OIL PO) Take by mouth.     GARLIC OIL PO Take by mouth.     IRON, FERROUS GLUCONATE, PO Take by mouth.     magnesium 30 MG tablet Take 30 mg by mouth 2 (two) times daily.      meloxicam  (MOBIC ) 7.5 MG tablet Take 1 tablet (7.5 mg total) by mouth 2 (two) times daily with a meal. Can take as needed during period 30 tablet 2   Probiotic Product (PROBIOTIC DAILY PO) Take by mouth.     tranexamic acid  (LYSTEDA ) 650 MG TABS tablet Take 2 tablets (1,300 mg total) by mouth 3 (three) times daily. Take during menses for a maximum of five days 30 tablet 2   valACYclovir (VALTREX) 500 MG tablet Take 500 mg by mouth daily. Reported on 09/23/2015     zinc gluconate 50 MG tablet Take 50 mg by mouth daily.     No current facility-administered medications on file prior to visit.    Allergies  Allergen Reactions   Citric Acid     Break out   Elemental Sulfur    Latex     Social History:  reports that she has quit smoking. She has never used smokeless tobacco. She reports current alcohol use. She reports that she does not use drugs.  Family History  Problem Relation Age of Onset   Hypertension Mother    Hypertension Father    Diabetes Father    Hypertension Sister    Diabetes Paternal Grandmother    Breast cancer Neg Hx     The  following portions of the patient's history were reviewed and updated as appropriate: allergies, current medications, past family history, past medical history, past social history, past surgical history and problem list.  Review of Systems Pertinent items noted in HPI and remainder of comprehensive ROS otherwise negative.  Physical Exam:  BP 128/83   Pulse 73   Ht 5' 4 (1.626 m)   Wt 171 lb (77.6 kg)   LMP 06/05/2023 (Exact Date)   BMI 29.35 kg/m  CONSTITUTIONAL: Well-developed, well-nourished female in no acute distress.  HENT:  Normocephalic, atraumatic, External right and left ear normal.  EYES: Conjunctivae and EOM are normal. Pupils are equal, round, and reactive to light. No scleral icterus.  NECK: Normal range of motion, supple, no masses.  Normal thyroid.  SKIN: Skin is warm and dry. No rash noted. Not diaphoretic. No erythema.  No pallor. MUSCULOSKELETAL: Normal range of motion. No tenderness.  No cyanosis, clubbing, or edema. NEUROLOGIC: Alert and oriented to person, place, and time. Normal reflexes, muscle tone coordination.  PSYCHIATRIC: Normal mood and affect. Normal behavior. Normal judgment and thought content. CARDIOVASCULAR: Normal heart rate noted, regular rhythm RESPIRATORY: Clear to auscultation bilaterally. Effort and breath sounds normal, no problems with respiration noted. BREASTS: Symmetric in size. No masses, tenderness, skin changes, nipple drainage, or lymphadenopathy bilaterally. Performed in the presence of a chaperone. ABDOMEN: Soft, no distention noted.  No tenderness, rebound or guarding.  PELVIC: Normal appearing external genitalia and urethral meatus; normal appearing vaginal mucosa and cervix.  No abnormal vaginal discharge noted.  Pap smear obtained.  Enlarged fibroid uterus palpated with diffuse tenderness to palpation no adnexal tenderness.  Performed in the presence of a chaperone.   Assessment and Plan:     1. Fibroids She desires fertility sparing options to help with pressure symptoms, discussed Sonata (not a quick fix) vs myomectomy.  Had lengthy discussion about these options, all questions answered.  She is interested in minimally invasive myomectomy options; but unsure about timing of getting pregnant. She advised that referral to infertility specialist can help with all these concerns, she will see who is in network for her.  Will discuss further after ultrasound results. - US  PELVIC COMPLETE WITH TRANSVAGINAL; Future  2. Vaginal discharge - Cervicovaginal ancillary only( [place]) done, will follow up results and manage accordingly. Proper vulvar hygiene emphasized: discussed avoidance of perfumed soaps, detergents, lotions and any type of douches; in addition to wearing cotton underwear and no underwear at night.  Also recommended cleaning front to back, voiding and cleaning up  after intercourse.   3. Routine screening for STI (sexually transmitted infection) - Cervicovaginal ancillary only( [place]) done, will follow up results and manage accordingly.  4. Well woman exam with routine gynecological exam (Primary) - Cytology - PAP Will follow up results of pap smear and manage accordingly. Normal breast examination today, she was advised to perform periodic self breast examinations.  Routine preventative health maintenance measures emphasized. Please refer to After Visit Summary for other counseling recommendations.      [name], MD, FACOG Obstetrician & Gynecologist, [facility] for [employer], [facility]

## 2023-06-26 LAB — CERVICOVAGINAL ANCILLARY ONLY
Bacterial Vaginitis (gardnerella): NEGATIVE
Candida Glabrata: NEGATIVE
Candida Vaginitis: NEGATIVE
Chlamydia: NEGATIVE
Comment: NEGATIVE
Comment: NEGATIVE
Comment: NEGATIVE
Comment: NEGATIVE
Comment: NEGATIVE
Comment: NORMAL
Neisseria Gonorrhea: NEGATIVE
Trichomonas: NEGATIVE

## 2023-06-27 LAB — CYTOLOGY - PAP
Comment: NEGATIVE
Diagnosis: NEGATIVE
High risk HPV: NEGATIVE

## 2023-07-04 ENCOUNTER — Other Ambulatory Visit (HOSPITAL_COMMUNITY): Payer: Medicaid Other

## 2023-07-10 ENCOUNTER — Ambulatory Visit (HOSPITAL_COMMUNITY): Payer: Medicaid Other

## 2023-07-13 ENCOUNTER — Ambulatory Visit (HOSPITAL_COMMUNITY)
Admission: RE | Admit: 2023-07-13 | Discharge: 2023-07-13 | Disposition: A | Discharge status: Home / Self Care | Payer: Medicaid Other | Source: Ambulatory Visit | Attending: Obstetrics & Gynecology | Admitting: Obstetrics & Gynecology

## 2023-07-13 DIAGNOSIS — D219 Benign neoplasm of connective and other soft tissue, unspecified: Secondary | ICD-10-CM | POA: Insufficient documentation

## 2023-07-13 NOTE — Addendum Note (Signed)
Addended by: Jaynie Collins A on: 07/13/2023 01:22 PM   Modules accepted: Orders

## 2023-07-14 ENCOUNTER — Encounter: Payer: Self-pay | Admitting: Obstetrics & Gynecology

## 2023-07-19 ENCOUNTER — Ambulatory Visit: Payer: Medicaid Other | Admitting: Obstetrics & Gynecology

## 2023-07-19 ENCOUNTER — Encounter: Payer: Self-pay | Admitting: Obstetrics & Gynecology

## 2023-07-19 VITALS — BP 127/82 | HR 97 | Wt 177.0 lb

## 2023-07-19 DIAGNOSIS — Z3162 Encounter for fertility preservation counseling: Secondary | ICD-10-CM | POA: Diagnosis not present

## 2023-07-19 DIAGNOSIS — D219 Benign neoplasm of connective and other soft tissue, unspecified: Secondary | ICD-10-CM | POA: Diagnosis not present

## 2023-07-19 NOTE — Progress Notes (Signed)
 GYNECOLOGY OFFICE VISIT NOTE  History:   Sophia Phillips is a 38 y.o. G0P0000 here today for follow up after recent ultrasound for fibroids.  Also wants to discuss fertility issues.  She denies any abnormal vaginal discharge, bleeding, pelvic pain or other concerns.    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  [ ]  cryotherapy vs LEEP       Past Surgical History:  Procedure Laterality Date   COLPOSCOPY     WISDOM TOOTH EXTRACTION      The following portions of the patient's history were reviewed and updated as appropriate: allergies, current medications, past family history, past medical history, past social history, past surgical history and problem list.   Health Maintenance:  Normal pap and negative HRHPV on 06/22/2023.    Review of Systems:  Pertinent items noted in HPI and remainder of comprehensive ROS otherwise negative.  Physical Exam:  BP 127/82   Pulse 97   Wt 177 lb (80.3 kg)   LMP 06/05/2023 (Exact Date)   BMI 30.38 kg/m  CONSTITUTIONAL: Well-developed, well-nourished female in no acute distress.  HEENT:  Normocephalic, atraumatic. External right and left ear normal. No scleral icterus.  NECK: Normal range of motion, supple, no masses noted on observation SKIN: No rash noted. Not diaphoretic. No erythema. No pallor. MUSCULOSKELETAL: Normal range of motion. No edema noted. NEUROLOGIC: Alert and oriented to person, place, and time. Normal muscle tone coordination. No cranial nerve deficit noted. PSYCHIATRIC: Normal mood and affect. Normal behavior. Normal judgment and thought content. CARDIOVASCULAR: Normal heart rate noted RESPIRATORY: Effort and breath sounds normal, no problems with respiration noted ABDOMEN: No masses noted. No other overt distention noted.   PELVIC: Deferred  Labs and Imaging US  PELVIC COMPLETE WITH  TRANSVAGINAL Result Date: 07/14/2023 : PROCEDURE: US  PELVIS COMPLETE WITH TRANSVAGINAL HISTORY: Patient is a 38 y/o F with fibroids, pelvic pressure. LMP 06/30/23. COMPARISON: US  Pelvis 03/29/22. TECHNIQUE: Two-dimensional transabdominal grayscale and color and spectral Doppler ultrasound of the pelvis was performed. Transvaginal was also performed. FINDINGS: The uterus is anteverted in position and measures 13.9 x 8.5 x 8.3 cm. It demonstrates a heterogeneous echotexture with multiple fibroids. The largest fibroid appears calcified and measures 5.5 x 5.3 cm within the mid body of the uterus. The endometrium measures 1.1 cm and demonstrates a normal homogeneous echotexture. A trace amount of fluid is noted within the cervix. The right ovary measures 4.5 x 3.6 x 2.9 cm and demonstrates a normal echotexture. There is normal color and spectral Doppler flow. The left ovary measures 4.8 x 4.7 x 3.5 cm and demonstrates a normal echotexture. There is normal color and spectral Doppler flow. There is no fluid present within the cul-de-sac. IMPRESSION: 1. Heterogeneous uterus with multiple fibroids. The largest fibroid measures 5.5 cm. 2. Normal spectral Doppler evaluation of the ovaries. Thank you for allowing us  to assist in the care of this patient. Electronically Signed   By: Lynwood Mains M.D.   On: 07/14/2023 07:31      Assessment and Plan:     1. Fibroids (Primary) Stable fibroid size compared to scan in 2023, this was discussed with patient.  No acute intervention needed.  2. Encounter for fertility preservation counseling Patient has been trying to conceive, also interested in possible oocyte preservation.  Discussed that she will benefit from consultation  with an Infertility specialist, recommended Dr. Johnston vs [facility].  She can talk to them about this, and they may also discuss any fertility-sparing/optimizing strategies with regards to her fibroids.  She will consider this, and let us  know  if she wants a referral.    Routine preventative health maintenance measures emphasized. Please refer to After Visit Summary for other counseling recommendations.   Return for any gynecologic concerns.    I spent 25 minutes dedicated to the care of this patient including pre-visit review of records, face to face time with the patient discussing her conditions and treatments, post visit ordering of medications and appropriate tests or procedures, coordinating care and documenting this visit encounter.    [name], MD, FACOG Obstetrician & Gynecologist, [facility] for [employer], [facility]

## 2023-07-20 ENCOUNTER — Encounter: Payer: Self-pay | Admitting: Obstetrics & Gynecology

## 2023-08-08 ENCOUNTER — Other Ambulatory Visit: Payer: Self-pay

## 2023-08-08 ENCOUNTER — Emergency Department (HOSPITAL_BASED_OUTPATIENT_CLINIC_OR_DEPARTMENT_OTHER)
Admission: EM | Admit: 2023-08-08 | Discharge: 2023-08-08 | Disposition: A | Discharge status: Home / Self Care | Payer: No Typology Code available for payment source | Attending: Emergency Medicine | Admitting: Emergency Medicine

## 2023-08-08 ENCOUNTER — Emergency Department (HOSPITAL_BASED_OUTPATIENT_CLINIC_OR_DEPARTMENT_OTHER): Payer: No Typology Code available for payment source

## 2023-08-08 ENCOUNTER — Encounter (HOSPITAL_BASED_OUTPATIENT_CLINIC_OR_DEPARTMENT_OTHER): Payer: Self-pay | Admitting: Emergency Medicine

## 2023-08-08 DIAGNOSIS — M542 Cervicalgia: Secondary | ICD-10-CM | POA: Insufficient documentation

## 2023-08-08 DIAGNOSIS — Z91040 Latex allergy status: Secondary | ICD-10-CM | POA: Diagnosis not present

## 2023-08-08 DIAGNOSIS — Y92410 Unspecified street and highway as the place of occurrence of the external cause: Secondary | ICD-10-CM | POA: Insufficient documentation

## 2023-08-08 DIAGNOSIS — S0990XA Unspecified injury of head, initial encounter: Secondary | ICD-10-CM | POA: Diagnosis present

## 2023-08-08 MED ORDER — CYCLOBENZAPRINE HCL 10 MG PO TABS: 10.0000 mg | ORAL_TABLET | Freq: Two times a day (BID) | ORAL | 0 refills | Status: AC | PRN
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
## 2023-12-12 ENCOUNTER — Encounter: Payer: Self-pay | Admitting: Obstetrics & Gynecology

## 2023-12-12 ENCOUNTER — Other Ambulatory Visit (HOSPITAL_COMMUNITY)
Admission: RE | Admit: 2023-12-12 | Discharge: 2023-12-12 | Disposition: A | Discharge status: Home / Self Care | Source: Ambulatory Visit | Attending: Obstetrics & Gynecology | Admitting: Obstetrics & Gynecology

## 2023-12-12 ENCOUNTER — Ambulatory Visit: Admitting: Obstetrics & Gynecology

## 2023-12-12 VITALS — BP 128/80 | HR 88 | Wt 175.0 lb

## 2023-12-12 DIAGNOSIS — N939 Abnormal uterine and vaginal bleeding, unspecified: Secondary | ICD-10-CM | POA: Diagnosis present

## 2023-12-12 MED ORDER — NAPROXEN 500 MG PO TABS: 500.0000 mg | ORAL_TABLET | Freq: Two times a day (BID) | ORAL | 2 refills | Status: AC

## 2023-12-12 NOTE — Patient Instructions (Signed)
 Next step for evaluation is endometrial biopsy  Next step for treatment will be progesterone therapy (pills, injection, IUD etc) if bleeding continues

## 2023-12-12 NOTE — Progress Notes (Signed)
 GYNECOLOGY OFFICE VISIT NOTE  History:  Sophia Phillips is a 38 y.o. G0P0000 with known fibroids here today for evaluation of abnormal uterine bleeding (AUB) for several months. She reports having prolonged periods, and spotting after periods. Wants evaluation.  She denies any abnormal vaginal discharge, pelvic pain or other concerns.  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  [ ]  cryotherapy vs LEEP       Past Surgical History:  Procedure Laterality Date   COLPOSCOPY     WISDOM TOOTH EXTRACTION      The following portions of the patient's history were reviewed and updated as appropriate: allergies, current medications, past family history, past medical history, past social history, past surgical history and problem list.   Health Maintenance:  Normal pap and negative HRHPV on 12/05/23.    Review of Systems:  Pertinent items noted in HPI and remainder of comprehensive ROS otherwise negative.  Physical Exam:  BP 128/80   Pulse 88   Wt 175 lb (79.4 kg)   LMP 12/05/2023   BMI 30.04 kg/m  CONSTITUTIONAL: Well-developed, well-nourished female in no acute distress.  HEENT:  Normocephalic, atraumatic. External right and left ear normal. No scleral icterus.  NECK: Normal range of motion, supple, no masses noted on observation SKIN: No rash noted. Not diaphoretic. No erythema. No pallor. MUSCULOSKELETAL: Normal range of motion. No edema noted. NEUROLOGIC: Alert and oriented to person, place, and time. Normal muscle tone coordination. No cranial nerve deficit noted on observation. PSYCHIATRIC: Normal mood and affect. Normal behavior. Normal judgment and thought content. CARDIOVASCULAR: Normal heart rate noted RESPIRATORY: Effort and breath sounds normal, no problems with respiration noted ABDOMEN: No masses or other overt distention noted on  observation. No tenderness.   PELVIC: Normal appearing external genitalia; normal urethral meatus; normal appearing vaginal mucosa and cervix.  Brown discharge noted, testing sample obtained.  Performed in the presence of a chaperone   Imaging: 07/14/2023  US  PELVIC COMPLETE WITH TRANSVAGINAL HISTORY: Patient is a 38 y/o F with fibroids, pelvic pressure. LMP 06/30/23. COMPARISON: US  Pelvis 03/29/22. TECHNIQUE: Two-dimensional transabdominal grayscale and color and spectral Doppler ultrasound of the pelvis was performed. Transvaginal was also performed. FINDINGS: The uterus is anteverted in position and measures 13.9 x 8.5 x 8.3 cm. It demonstrates a heterogeneous echotexture with multiple fibroids. The largest fibroid appears calcified and measures 5.5 x 5.3 cm within the mid body of the uterus. The endometrium measures 1.1 cm and demonstrates a normal homogeneous echotexture. A trace amount of fluid is noted within the cervix. The right ovary measures 4.5 x 3.6 x 2.9 cm and demonstrates a normal echotexture. There is normal color and spectral Doppler flow. The left ovary measures 4.8 x 4.7 x 3.5 cm and demonstrates a normal echotexture. There is normal color and spectral Doppler flow. There is no fluid present within the cul-de-sac. IMPRESSION: 1. Heterogeneous uterus with multiple fibroids. The largest fibroid measures 5.5 cm. 2. Normal spectral Doppler evaluation of the ovaries. Thank you for allowing us  to assist in the care of this patient. Electronically Signed   By: Lynwood Mains M.D.   On: 07/14/2023 07:31  Assessment and Plan:    1. Abnormal uterine bleeding (AUB) (Primary) Discussed possible etiologies, likely a combination of hormonal fluctuations/fibroid associated bleeding.  Also concerned about possible inflammation. Labs done, will follow  up results and manage accordingly. Recommended endometrial biopsy, she wants to defer this until next visit. Discussed management options for now. Can start  with NSAIDs, then consider hormonal therapy (progestin therapy).  Patient is agreeable to this.  Bleeding precautions reviewed. - Beta hCG quant (ref lab) - TSH Rfx on Abnormal to Free T4 - CBC - Cervicovaginal ancillary only - naproxen  (NAPROSYN ) 500 MG tablet; Take 1 tablet (500 mg total) by mouth 2 (two) times daily with a meal. As needed for pain  Dispense: 30 tablet; Refill: 2   Routine preventative health maintenance measures emphasized. Please refer to After Visit Summary for other counseling recommendations.   Return in about 2 weeks (around 12/26/2023) for Follow up and possible endometrial biopsy.    I spent 25 minutes dedicated to the care of this patient including pre-visit review of records, face to face time with the patient discussing her conditions and treatments, post visit ordering of medications and appropriate tests or procedures, coordinating care and documenting this visit encounter.    [name], MD, FACOG Obstetrician & Gynecologist, [facility] for [employer], [facility]

## 2023-12-13 ENCOUNTER — Ambulatory Visit: Payer: Self-pay | Admitting: Obstetrics & Gynecology

## 2023-12-13 DIAGNOSIS — D500 Iron deficiency anemia secondary to blood loss (chronic): Secondary | ICD-10-CM

## 2023-12-13 LAB — CBC
Hematocrit: 33.3 % — ABNORMAL LOW (ref 34.0–46.6)
Hemoglobin: 9.4 g/dL — ABNORMAL LOW (ref 11.1–15.9)
MCH: 22 pg — ABNORMAL LOW (ref 26.6–33.0)
MCHC: 28.2 g/dL — ABNORMAL LOW (ref 31.5–35.7)
MCV: 78 fL — ABNORMAL LOW (ref 79–97)
Platelets: 391 10*3/uL (ref 150–450)
RBC: 4.27 x10E6/uL (ref 3.77–5.28)
RDW: 15.8 % — ABNORMAL HIGH (ref 11.7–15.4)
WBC: 6.4 10*3/uL (ref 3.4–10.8)

## 2023-12-13 LAB — TSH RFX ON ABNORMAL TO FREE T4: TSH: 1.49 u[IU]/mL (ref 0.450–4.500)

## 2023-12-13 LAB — BETA HCG QUANT (REF LAB): hCG Quant: <1 m[IU]/mL

## 2023-12-13 MED ORDER — FERRIC MALTOL 30 MG PO CAPS: 1.0000 | ORAL_CAPSULE | Freq: Two times a day (BID) | ORAL | 2 refills | Status: AC

## 2023-12-14 LAB — CERVICOVAGINAL ANCILLARY ONLY
Bacterial Vaginitis (gardnerella): NEGATIVE
Candida Glabrata: NEGATIVE
Candida Vaginitis: NEGATIVE
Chlamydia: NEGATIVE
Comment: NEGATIVE
Comment: NEGATIVE
Comment: NEGATIVE
Comment: NEGATIVE
Comment: NEGATIVE
Comment: NORMAL
Neisseria Gonorrhea: NEGATIVE
Trichomonas: NEGATIVE

## 2024-01-09 ENCOUNTER — Ambulatory Visit: Admitting: Obstetrics & Gynecology

## 2024-04-24 NOTE — Progress Notes (Signed)
 Office Follow up Note  04/24/2024  Interval History:  Sophia Phillips is  38 y.o. yrs old female who returns in follow-up today. 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  She initially came to see me for evaluation of exertional shortness of breath.  She feels much better in terms of shortness of breath after correction of anemia.  She says her recent hemoglobin level was around 10.  Hemoglobin last year was around 8.  Her mother had coronary disease diagnosed in her 56s, maternal grandfather died of massive heart attack at 25. In terms of exercise, she swims on regular basis.  No exertional chest pain or dyspnea.  She is soon planning to start kickboxing. Echocardiogram was with no major structural abnormality.  Holter monitor revealed rare PAC/PVC.   Echocardiogram 10/19/2022   Left Ventricle: Left ventricle size is normal.   Left Ventricle: Systolic function is normal. EF: 60-65%.   Right Ventricle: Right ventricle size is normal.   Right Ventricle: Systolic function is normal.   Tricuspid Valve: The right ventricular systolic pressure is normal (<36 mmHg).   Holter monitor 09/26/2022 The patient was monitored for a total of 4d 23h, underlying rhythm is Sinus. The minimum heart rate was 63 bpm; the maximum 164 bpm; the average 89 bpm.  No evidence of atrial flutter/fibrillation, no evidence of high-grade AV block or pathological pauses.  There were a total of 19 PVCs with 1 morphologies and 1 couplets. Overall PVC Burden at < 0.01 % There were a total of 0 Other Beats. There were 0 total number of paced beats.  There were a total of 1143 PSVCs with 1 morphologies and 0 couplets. Overall PSVC Burden at 0.18 %  There is a total of 2 patient events -during sinus rhythm and sinus tachycardia.   Current Medications[1]  REVIEW OF SYSTEMS: The patient denies nausea, vomiting, diarrhea, fever, chills,  or bleeding. All other systems are reviewed and are negative except for that mentioned in the history of present illness.  LABS:   Lab Results  Component Value Date   Cholesterol, Total 170 05/08/2023   Cholesterol, Total 162 02/15/2023   Lab Results  Component Value Date   HDL 61 05/08/2023   HDL 50 02/15/2023   Lab Results  Component Value Date   LDL 98 05/08/2023   LDL 86 02/15/2023   Lab Results  Component Value Date   Triglycerides 54 05/08/2023   Triglycerides 151 (H) 02/15/2023   No results found for: [facility]  Lab Results  Component Value Date   WBC 9.8 02/15/2023   HGB 8.5 (L) 02/15/2023   HCT 27.6 (L) 02/15/2023   MCV 65.9 (L) 02/15/2023   Plt Ct 415 (H) 02/15/2023    Lab Results  Component Value Date   Creatinine 0.60 02/15/2023   BUN 5 (L) 02/15/2023   Sodium 139 02/15/2023   Potassium 4.7 02/15/2023   Chloride 104 02/15/2023   CO2 23 02/15/2023   Lab Results  Component Value Date   TSH 2.400 02/15/2023    PHYSICAL EXAM:  Vital Signs:  BP 121/72 (BP Location: Right Upper Arm, Patient Position: Sitting)   Pulse 75   Wt 178 lb (80.7 kg)   SpO2 99%   BMI 30.55 kg/m  Constitutional: Well-nourished well-developed ,  no acute distress.  HENT:normocephalic,atraumatic,oropharynx moist,no oral exudates.   Neck: Supple. No JVD. Carotid bruits: Absent bilaterally. Cardiovascular: RRR. No murmur, No gallop. No rub. Respiratory: Mildly decreased breath sounds bilaterally.  No  wheezes or crackles noted GI: Soft, nontender, nondistended, with normal bowel sounds, no masses or hepatosplenomegaly.  Skin: Warm, dry, no erythema, no rash. Musculoskeletal: No edema, no tenderness, no cyanosis, no clubbing. Pulses: 1+ and intact bilaterally  Impression and plan:    1. SOB (shortness of breath)  ECG 12 lead    2. Iron deficiency anemia due to chronic blood loss      3. Family history of premature CAD         With correction of anemia, shortness of  breath has improved significantly.  She exercises on routine basis-swims at least 2-3 times a week, no limiting dyspnea.  She is not in heart failure.  Echocardiogram last year was within normal limit.  No angina is reported. Lipid panel within normal limit.  She does have family history of premature coronary artery disease.  CT coronary calcium scan could be a potential option for her.  Will discuss about it during follow-up visit. If hemoglobin rises consistently more than 10 g/dL, she is interested in treadmill stress test.  She will call us  with repeat hemoglobin in next few weeks.  We had discussion about importance of life style modification, keeping BP, blood sugar and cholesterol, body weight at goal, role of regular exercise (at least 30 minutes of moderate intensity aerobic exercise 5 days in a week), following low fat, low carb, low salt heart healthy diet, abstinence from smoking and drinking etc.  We also discussed about visiting PCP visit for non cardiac issues, importance of regular follow up, health screening  and medication compliance.    Marcello MARLA Lennox, MD    Note: This documented was generated using voice recognition software. There may be unintended transcription errors that were not detected upon document review.               [1]  Current Outpatient Medications:    ASHWAGANDHA PO, Take by mouth., Disp: , Rfl:    B Complex-C (B-COMPLEX WITH VITAMIN C) tablet, Take one tablet by mouth daily., Disp: , Rfl:    BLACK CURRANT SEED OIL PO, Take by mouth., Disp: , Rfl:    Calcium Citrate-Vitamin D 250-2.5 MG-MCG TABS, Take 1 tablet by mouth 2 (two) times daily., Disp: , Rfl:    DANDELION PO, Take by mouth., Disp: , Rfl:    Flaxseed, Linseed, 1000 MG CAPS, Take by mouth., Disp: , Rfl:    GARLIC OIL PO, Take by mouth., Disp: , Rfl:    magnesium 30 MG tablet, Take one tablet (30 mg dose) by mouth., Disp: , Rfl:    meloxicam  (MOBIC ) 7.5 mg tablet, Take one tablet  (7.5 mg dose) by mouth daily., Disp: , Rfl:    Multiple Vitamin (MULTI-VITAMIN) tablet, Take one tablet by mouth daily., Disp: , Rfl:    Probiotic Product (PROBIOTIC-10 PO), Take by mouth., Disp: , Rfl:    zinc gluconate 50 MG tablet, Take one tablet (50 mg dose) by mouth daily., Disp: , Rfl:

## 2024-05-28 ENCOUNTER — Ambulatory Visit: Admitting: Obstetrics & Gynecology

## 2024-07-10 NOTE — Progress Notes (Addendum)
 An After Visit Summary was printed and given to the patient. Suprep instructions provided to and reviewed with patient  Patient advised of the following medication holds and/or adjustments: Ferrous Maltol and multivitamin- iron hold 4 days prior to procedure date.  Patient advised to contact office with any changes in medications and/or condition.   Labs printed and given to patient.

## 2024-07-16 ENCOUNTER — Ambulatory Visit: Admitting: Obstetrics & Gynecology

## 2024-07-16 VITALS — BP 120/76 | HR 96 | Ht 64.0 in | Wt 179.2 lb

## 2024-07-16 DIAGNOSIS — D219 Benign neoplasm of connective and other soft tissue, unspecified: Secondary | ICD-10-CM

## 2024-07-16 DIAGNOSIS — Z0000 Encounter for general adult medical examination without abnormal findings: Secondary | ICD-10-CM

## 2024-07-16 DIAGNOSIS — Z01419 Encounter for gynecological examination (general) (routine) without abnormal findings: Secondary | ICD-10-CM | POA: Diagnosis not present

## 2024-07-16 DIAGNOSIS — N938 Other specified abnormal uterine and vaginal bleeding: Secondary | ICD-10-CM

## 2024-07-16 DIAGNOSIS — Z1231 Encounter for screening mammogram for malignant neoplasm of breast: Secondary | ICD-10-CM

## 2024-07-16 DIAGNOSIS — N939 Abnormal uterine and vaginal bleeding, unspecified: Secondary | ICD-10-CM

## 2024-07-17 NOTE — Progress Notes (Signed)
 "   GYNECOLOGY ANNUAL PREVENTATIVE CARE ENCOUNTER NOTE  History:    Sophia Phillips is a 39 y.o. G0P0000 female here for a routine annual gynecologic exam.  Current complaints: continued pelvic pressure and abnormal uterine bleeding (heavy periods, spotting after periods) due to fibroids.  She desires an ultrasound for surveillance of size of fibroids. Not currently using any intervention, has used lysteda  in the past. Has been hesitant to do hormonal methods, also wants to options that are fertility sparing.  Denies current abnormal vaginal bleeding, discharge, pelvic pain, problems with intercourse or other gynecologic concerns.  Gynecologic History Patient's last menstrual period was 07/05/2024. Contraception: none Last Pap: 06/22/2023. 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  [ ]  cryotherapy vs LEEP       Past Surgical History:  Procedure Laterality Date   COLPOSCOPY     WISDOM TOOTH EXTRACTION      Medications Ordered Prior to Encounter[1]  Allergies[2]  Social History:  reports that she has quit smoking. She has never used smokeless tobacco. She reports current alcohol use. She reports that she does not use drugs.  Family History  Problem Relation Age of Onset   Hypertension Mother    Hypertension Father    Diabetes Father    Hypertension Sister    Diabetes Paternal Grandmother    Breast cancer Neg Hx     The following portions of the patient's history were reviewed and updated as appropriate: allergies, current medications, past family history, past medical history, past social history, past surgical history and problem list.  Review of Systems Pertinent items  noted in HPI and remainder of comprehensive ROS otherwise negative.  Physical Exam: Chaperone Elvie Saba, CMA  BP 120/76   Pulse 96   Ht 5' 4 (1.626 m)   Wt 179 lb 3.2 oz (81.3 kg)   LMP 07/05/2024   BMI 30.76 kg/m  CONSTITUTIONAL: Well-developed, well-nourished female in no acute distress.  HENT:  Normocephalic, atraumatic, External right and left ear normal.  EYES: Conjunctivae and EOM are normal. Pupils are equal, round, and reactive to light. No scleral icterus.  NECK: Normal range of motion, supple, no masses observed. SKIN: Skin is warm and dry. No rash noted. Not diaphoretic. No erythema. No pallor. MUSCULOSKELETAL: Normal range of motion. No tenderness.  No cyanosis, clubbing, or edema. NEUROLOGIC: Alert and oriented to person, place, and time. Normal muscle tone coordination.  PSYCHIATRIC: Normal mood and affect. Normal behavior. Normal judgment and thought content. CARDIOVASCULAR: Normal heart rate noted, regular rhythm RESPIRATORY: Clear to auscultation bilaterally. Effort and breath sounds normal, no problems with respiration noted. BREASTS: Deferred by patient (reports normal exam by PCP recently) ABDOMEN: Soft, no distention noted.  No tenderness, rebound or guarding. Enlarged fibroid uterus palpated. PELVIC: Normal appearing external genitalia and urethral meatus; normal appearing vaginal mucosa and cervix.  No abnormal vaginal discharge or current bleeding noted.  Pap smear obtained.  Enlarged uterine size, no other palpable masses, no uterine or adnexal tenderness.  Performed in the presence of a chaperone.  Assessment and Plan:     1. Fibroids 2.  Abnormal uterine bleeding (AUB) Discussed hormonal methods again; progestin therapy vs MyFembree and also fertility sparing surgical options ([place], minimally invasive myomectomy).  She is unsure of what she wants for now.  Recommended repeat prescription of NSAIDs and Lysteda  for  now, she declined this. Will obtain  ultrasound, will follow up results and manage accordingly.  Given the duration of this bleeding and her age, endometrial sampling could be indicated and will discuss this further with patient.  Bleeding precautions reviewed. - US  PELVIC COMPLETE WITH TRANSVAGINAL; Future  3. Breast cancer screening by mammogram Mammogram scheduled for breast cancer screening after she turns 39 later this year. - MM 3D SCREENING MAMMOGRAM BILATERAL BREAST; Future  4. Well woman exam (no gynecological exam) (Primary) - Cytology - PAP( [facility]) Will follow up results of pap smear and manage accordingly. Routine preventative health maintenance measures emphasized. Please refer to After Visit Summary for other counseling recommendations.      [name], MD, FACOG Obstetrician & Gynecologist, [facility] for [facility], [facility]     [1]  Current Outpatient Medications on File Prior to Visit  Medication Sig Dispense Refill   ASHWAGANDHA PO Take by mouth.     BLACK CURRANT SEED OIL PO Take by mouth.     calcium-vitamin D 250-100 MG-UNIT tablet Take 1 tablet by mouth 2 (two) times daily.     DANDELION PO Take by mouth.     Ferric Maltol  30 MG CAPS Take 1 capsule (30 mg total) by mouth 2 (two) times daily. Please take one hour before breakfast and dinner 60 capsule 2   Flaxseed, Linseed, (FLAX SEED OIL PO) Take by mouth.     GARLIC OIL PO Take by mouth.     IRON, FERROUS GLUCONATE, PO Take by mouth.     magnesium 30 MG tablet Take 30 mg by mouth 2 (two) times daily.     naproxen  (NAPROSYN ) 500 MG tablet Take 1 tablet (500 mg total) by mouth 2 (two) times daily with a meal. As needed for pain 30 tablet 2   Probiotic Product (PROBIOTIC DAILY PO) Take by mouth.     tranexamic acid  (LYSTEDA ) 650 MG TABS tablet Take 2 tablets (1,300 mg total) by mouth 3 (three) times daily. Take during menses for a maximum of five days 30 tablet 2   valACYclovir (VALTREX) 500 MG  tablet Take 500 mg by mouth daily. Reported on 09/23/2015     zinc gluconate 50 MG tablet Take 50 mg by mouth daily.     No current facility-administered medications on file prior to visit.  [2]  Allergies Allergen Reactions   Sulfur Other (See Comments)   Citric Acid     Break out   Elemental Sulfur    Latex    "

## 2024-07-18 ENCOUNTER — Ambulatory Visit: Payer: Self-pay | Admitting: Obstetrics & Gynecology

## 2024-07-18 LAB — CYTOLOGY - PAP
Comment: NEGATIVE
Diagnosis: NEGATIVE
High risk HPV: NEGATIVE

## 2024-07-24 ENCOUNTER — Ambulatory Visit (HOSPITAL_COMMUNITY)
